# Patient Record
Sex: Male | Born: 1945 | Hispanic: No | Marital: Married | Smoking: Former smoker
Health system: Southern US, Community
[De-identification: ages and names within clinical notes are randomized; demographics above are authoritative.]

## PROBLEM LIST (undated history)

## (undated) DIAGNOSIS — S82201M Unspecified fracture of shaft of right tibia, subsequent encounter for open fracture type I or II with nonunion: Secondary | ICD-10-CM

## (undated) DIAGNOSIS — E785 Hyperlipidemia, unspecified: Secondary | ICD-10-CM

## (undated) DIAGNOSIS — I10 Essential (primary) hypertension: Secondary | ICD-10-CM

## (undated) DIAGNOSIS — E119 Type 2 diabetes mellitus without complications: Secondary | ICD-10-CM

## (undated) DIAGNOSIS — I255 Ischemic cardiomyopathy: Secondary | ICD-10-CM

## (undated) DIAGNOSIS — I509 Heart failure, unspecified: Secondary | ICD-10-CM

## (undated) DIAGNOSIS — Z950 Presence of cardiac pacemaker: Secondary | ICD-10-CM

## (undated) DIAGNOSIS — I495 Sick sinus syndrome: Secondary | ICD-10-CM

## (undated) DIAGNOSIS — I2510 Atherosclerotic heart disease of native coronary artery without angina pectoris: Secondary | ICD-10-CM

## (undated) DIAGNOSIS — J45909 Unspecified asthma, uncomplicated: Secondary | ICD-10-CM

## (undated) DIAGNOSIS — S82401M Unspecified fracture of shaft of right fibula, subsequent encounter for open fracture type I or II with nonunion: Secondary | ICD-10-CM

## (undated) HISTORY — PX: APPENDECTOMY: SHX54

## (undated) HISTORY — DX: Unspecified asthma, uncomplicated: J45.909

## (undated) HISTORY — PX: HERNIA REPAIR: SHX51

## (undated) HISTORY — DX: Ischemic cardiomyopathy: I25.5

## (undated) HISTORY — DX: Essential (primary) hypertension: I10

## (undated) HISTORY — PX: FRACTURE SURGERY: SHX138

## (undated) HISTORY — DX: Heart failure, unspecified: I50.9

## (undated) HISTORY — DX: Atherosclerotic heart disease of native coronary artery without angina pectoris: I25.10

## (undated) HISTORY — DX: Hyperlipidemia, unspecified: E78.5

## (undated) HISTORY — DX: Presence of cardiac pacemaker: Z95.0

## (undated) HISTORY — DX: Sick sinus syndrome: I49.5

## (undated) HISTORY — PX: CATARACT EXTRACTION: SUR2

---

## 2005-12-25 ENCOUNTER — Ambulatory Visit (HOSPITAL_COMMUNITY): Admission: RE | Admit: 2005-12-25 | Discharge: 2005-12-25 | Payer: Self-pay | Admitting: Cardiovascular Disease

## 2005-12-25 ENCOUNTER — Ambulatory Visit: Payer: Self-pay | Admitting: Cardiovascular Disease

## 2005-12-30 ENCOUNTER — Inpatient Hospital Stay (HOSPITAL_BASED_OUTPATIENT_CLINIC_OR_DEPARTMENT_OTHER): Admission: RE | Admit: 2005-12-30 | Discharge: 2005-12-30 | Payer: Self-pay | Admitting: Cardiovascular Disease

## 2005-12-30 ENCOUNTER — Ambulatory Visit: Payer: Self-pay | Admitting: Cardiovascular Disease

## 2006-01-20 ENCOUNTER — Observation Stay (HOSPITAL_COMMUNITY): Admission: RE | Admit: 2006-01-20 | Discharge: 2006-01-21 | Payer: Self-pay | Admitting: Internal Medicine

## 2006-01-20 ENCOUNTER — Ambulatory Visit: Payer: Self-pay | Admitting: Internal Medicine

## 2006-01-20 HISTORY — PX: OTHER SURGICAL HISTORY: SHX169

## 2006-02-04 ENCOUNTER — Ambulatory Visit: Payer: Self-pay

## 2006-03-16 ENCOUNTER — Inpatient Hospital Stay (HOSPITAL_COMMUNITY): Admission: AD | Admit: 2006-03-16 | Discharge: 2006-03-21 | Payer: Self-pay | Admitting: Internal Medicine

## 2006-03-16 ENCOUNTER — Ambulatory Visit: Payer: Self-pay | Admitting: Internal Medicine

## 2006-03-17 ENCOUNTER — Ambulatory Visit: Payer: Self-pay | Admitting: Infectious Diseases

## 2006-03-18 ENCOUNTER — Encounter: Payer: Self-pay | Admitting: Internal Medicine

## 2006-05-12 ENCOUNTER — Ambulatory Visit: Payer: Self-pay | Admitting: Internal Medicine

## 2006-07-31 ENCOUNTER — Ambulatory Visit: Payer: Self-pay | Admitting: Internal Medicine

## 2006-08-11 ENCOUNTER — Ambulatory Visit: Payer: Self-pay | Admitting: Internal Medicine

## 2006-09-28 IMAGING — CR DG CHEST 2V
2 series · 2 of 2 positions shown · non-contrast
Comparison: 01/20/06.

CLINICAL DATA: Left ventricular dysfunction; status-post placement of an implantable defibrillator. 
 CHEST - 2 VIEW:

[w chest pa]
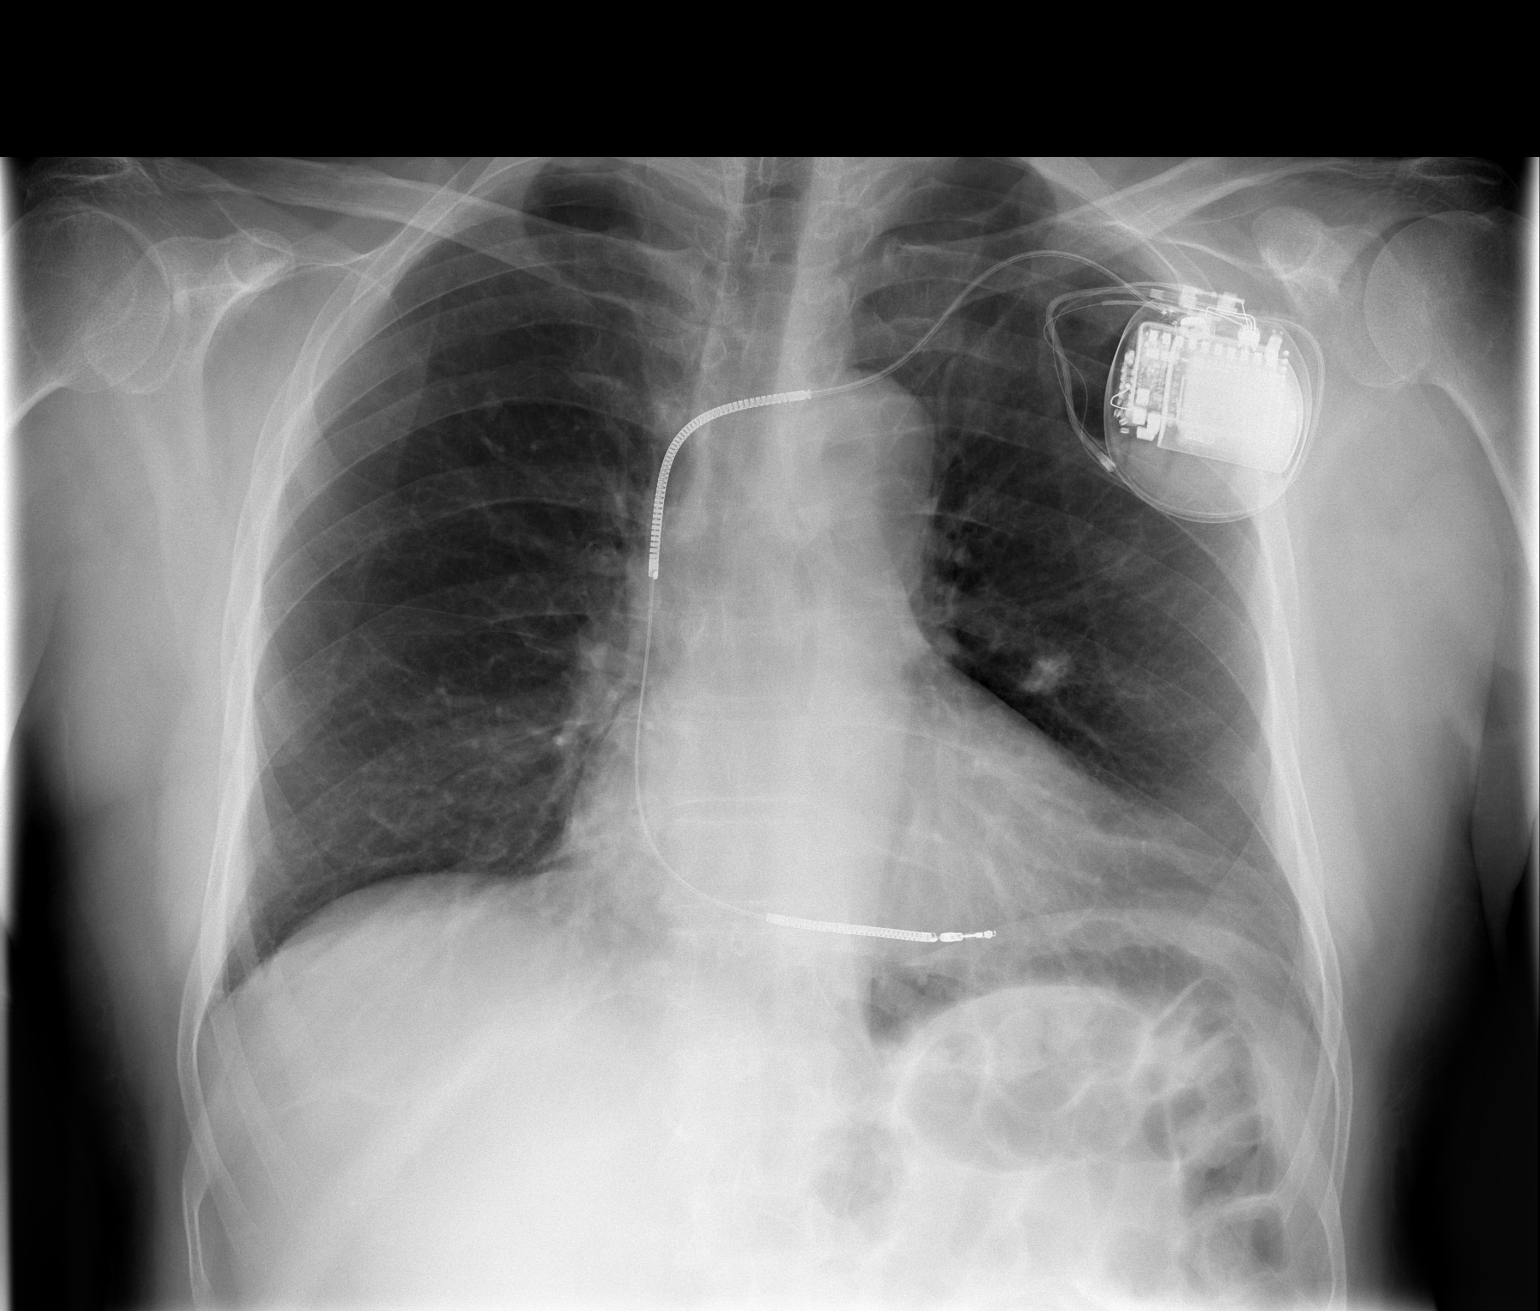

[w chest lat]
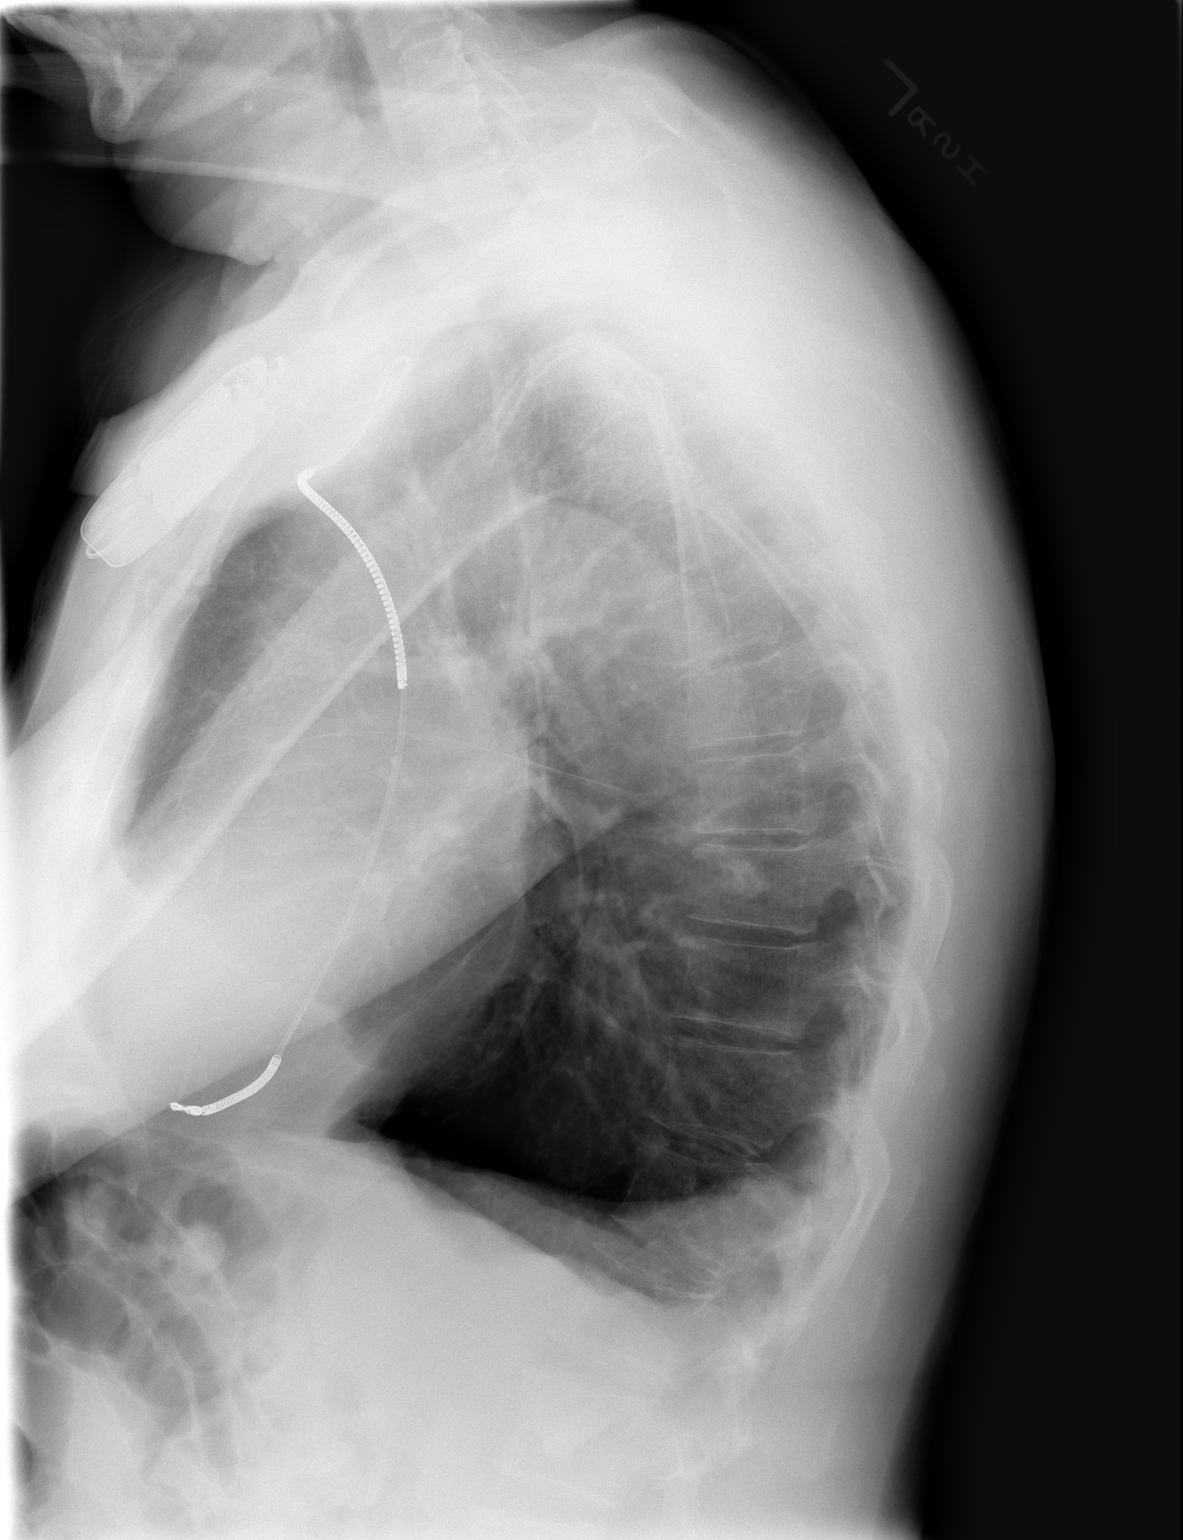

[2 of 2 positions shown; findings below may reference images not displayed]

FINDINGS: A defibrillator has been placed with a right ventricular lead which appears in satisfactory position.  There is no pneumothorax.  Again seen is the stable calcified granuloma within the left midlung zone.  There are no infiltrative or edematous changes.  There are mild changes of COPD.   There is a probable small granuloma within the right upper lobe as well.
IMPRESSION: Placement of implantable defibrillator with lead in satisfactory position radiographically.  No pneumothorax.  Evidence for previous granulomatous disease.

## 2006-10-28 ENCOUNTER — Ambulatory Visit: Payer: Self-pay | Admitting: Internal Medicine

## 2007-01-27 ENCOUNTER — Ambulatory Visit: Payer: Self-pay | Admitting: Internal Medicine

## 2007-02-09 ENCOUNTER — Ambulatory Visit: Payer: Self-pay | Admitting: Internal Medicine

## 2007-05-05 ENCOUNTER — Ambulatory Visit: Payer: Self-pay | Admitting: Internal Medicine

## 2007-05-17 ENCOUNTER — Ambulatory Visit: Payer: Self-pay | Admitting: Internal Medicine

## 2007-08-02 ENCOUNTER — Ambulatory Visit: Payer: Self-pay | Admitting: Internal Medicine

## 2007-10-29 ENCOUNTER — Ambulatory Visit: Payer: Self-pay | Admitting: Internal Medicine

## 2008-02-21 ENCOUNTER — Ambulatory Visit: Payer: Self-pay | Admitting: Internal Medicine

## 2008-11-15 ENCOUNTER — Encounter: Payer: Self-pay | Admitting: Internal Medicine

## 2009-02-03 DIAGNOSIS — I429 Cardiomyopathy, unspecified: Secondary | ICD-10-CM

## 2009-02-03 DIAGNOSIS — E785 Hyperlipidemia, unspecified: Secondary | ICD-10-CM

## 2009-02-03 DIAGNOSIS — I509 Heart failure, unspecified: Secondary | ICD-10-CM

## 2009-02-03 DIAGNOSIS — I2589 Other forms of chronic ischemic heart disease: Secondary | ICD-10-CM

## 2009-02-03 DIAGNOSIS — I495 Sick sinus syndrome: Secondary | ICD-10-CM

## 2009-02-03 DIAGNOSIS — I10 Essential (primary) hypertension: Secondary | ICD-10-CM

## 2009-02-03 DIAGNOSIS — I2510 Atherosclerotic heart disease of native coronary artery without angina pectoris: Secondary | ICD-10-CM

## 2009-02-03 HISTORY — DX: Atherosclerotic heart disease of native coronary artery without angina pectoris: I25.10

## 2009-02-03 HISTORY — DX: Hyperlipidemia, unspecified: E78.5

## 2009-02-03 HISTORY — DX: Sick sinus syndrome: I49.5

## 2009-02-03 HISTORY — DX: Essential (primary) hypertension: I10

## 2009-02-03 HISTORY — DX: Cardiomyopathy, unspecified: I42.9

## 2009-02-28 ENCOUNTER — Encounter (INDEPENDENT_AMBULATORY_CARE_PROVIDER_SITE_OTHER): Payer: Self-pay | Admitting: *Deleted

## 2009-04-13 ENCOUNTER — Encounter (INDEPENDENT_AMBULATORY_CARE_PROVIDER_SITE_OTHER): Payer: Self-pay | Admitting: *Deleted

## 2009-07-10 ENCOUNTER — Ambulatory Visit: Payer: Self-pay | Admitting: Internal Medicine

## 2009-07-10 DIAGNOSIS — Z95810 Presence of automatic (implantable) cardiac defibrillator: Secondary | ICD-10-CM

## 2009-07-10 HISTORY — DX: Presence of automatic (implantable) cardiac defibrillator: Z95.810

## 2009-10-23 ENCOUNTER — Encounter: Payer: Self-pay | Admitting: Internal Medicine

## 2009-12-03 ENCOUNTER — Encounter: Payer: Self-pay | Admitting: Internal Medicine

## 2010-01-03 ENCOUNTER — Telehealth (INDEPENDENT_AMBULATORY_CARE_PROVIDER_SITE_OTHER): Payer: Self-pay | Admitting: *Deleted

## 2010-03-18 ENCOUNTER — Telehealth (INDEPENDENT_AMBULATORY_CARE_PROVIDER_SITE_OTHER): Payer: Self-pay | Admitting: *Deleted

## 2010-04-09 ENCOUNTER — Encounter: Payer: Self-pay | Admitting: Internal Medicine

## 2010-09-10 ENCOUNTER — Encounter (INDEPENDENT_AMBULATORY_CARE_PROVIDER_SITE_OTHER): Payer: Self-pay | Admitting: *Deleted

## 2010-10-02 ENCOUNTER — Encounter: Payer: Self-pay | Admitting: Internal Medicine

## 2010-10-22 NOTE — Letter (Signed)
Summary: Device-Delinquent Phone Journalist, newspaper, Main Office  [address]   [place], [place] [address]   Phone: [phone]  Fax: [phone]     April 09, 2010 MRN: [id number]   [name] [address] [place], [place]  [address]   Dear [name],  According to our records, you were scheduled for a device phone transmission on  10-08-2009.     We did not receive any results from this check.  If you transmitted on your scheduled day, please call us to help troubleshoot your system.  If you forgot to send your transmission, please send one upon receipt of this letter.   Thank you,   Architectural technologist [facility]

## 2010-10-22 NOTE — Progress Notes (Signed)
Summary: CALLING TO SCHEDLUE A PACER PHONE CHECK   Phone Note Call from Patient   Caller: Patient Summary of Call: PT CALLING TO SCHEDULE A PACER PHONE CHECK APPT. Initial call taken by: Judie Grieve,  January 03, 2010 9:46 AM  Follow-up for Phone Call        No appointment needed, they can send transmission at will.  Patient aware. Follow-up by: Altha Harm, LPN,  January 03, 2010 2:35 PM

## 2010-10-22 NOTE — Letter (Signed)
Summary: Device-Delinquent Phone Journalist, newspaper, Main Office  [address]   [place], [place] [address]   Phone: [phone]  Fax: [phone]     December 03, 2009 MRN: [id number]   [name] [address] [place], [place]  [address]   Dear Mr. Deleonardis,  According to our records, you were scheduled for a device phone transmission on October 08, 2009.     We did not receive any results from this check.  If you transmitted on your scheduled day, please call us to help troubleshoot your system.  If you forgot to send your transmission, please send one upon receipt of this letter.  Thank you,   Architectural technologist [facility]

## 2010-10-22 NOTE — Progress Notes (Signed)
Summary: [name]  Records request received from the fax machine. Forwarded to [employer] for processing.

## 2010-10-22 NOTE — Progress Notes (Signed)
Summary: device transmit   Phone Note Call from Patient Call back at Home Phone [phone]   Caller: Patient Reason for Call: Talk to Nurse Summary of Call: having trouble with device transmit Initial call taken by: Migdalia Dk,  January 03, 2010 3:34 PM  Follow-up for Phone Call        Patient unable to do remote follow up. He will call and make an appointment. Follow-up by: Altha Harm, LPN,  January 03, 2010 4:15 PM

## 2010-10-22 NOTE — Letter (Signed)
Summary: Device-Delinquent Phone Journalist, newspaper, Main Office  [address]   [place], [place] [address]   Phone: [phone]  Fax: [phone]     October 23, 2009 MRN: [id number]   [name] [address] [place], [place]  [address]   Dear Mr. Palka,  According to our records, you were scheduled for a device phone transmission on   October 08, 2009.     We did not receive any results from this check.  If you transmitted on your scheduled day, please call us to help troubleshoot your system.  If you forgot to send your transmission, please send one upon receipt of this letter.  Thank you,   Architectural technologist [facility]

## 2010-10-24 NOTE — Letter (Signed)
Summary: Device-Delinquent Check  [facility], Main Office  [address]   [place], [place] [address]   Phone: [phone]  Fax: [phone]     September 10, 2010 MRN: [id number]   METE PURDUM [address] [place], [place]  [address]   Dear Mr. Bellavance,  According to our records, you have not had your implanted device checked in the recommended period of time.  We are unable to determine appropriate device function without checking your device on a regular basis.  Please call our office to schedule an appointment, with Dr Audree Camel soon as possible.  If you are having your device checked by another physician, please call us so that we may update our records.  Thank you,  Letta Moynahan, EMT  September 10, 2010 12:45 PM  [facility] certified

## 2010-10-24 NOTE — Cardiovascular Report (Signed)
Summary: Certified Letter Signed - No Appt Made  Certified Letter Signed - No Appt Made   Imported By: Debby Freiberg 10/10/2010 11:17:26  _____________________________________________________________________  External Attachment:    Type:   Image     Comment:   External Document

## 2011-02-04 NOTE — Assessment & Plan Note (Signed)
[facility]                         ELECTROPHYSIOLOGY OFFICE NOTE   [name], [name]                         MRN:          [id number]  DATE:02/21/2008                            DOB:          April 13, 1946    Ms. Hershberger returns today for follow-up.  He is very pleasant, middle-aged  male with an ischemic cardiomyopathy status post out of hospital MI with  occluded LAD and severe LV dysfunction, EF 25%.  He has had class II  heart failure approaching class III.  He denies chest pain.  He has  dyspnea on exertion.  He denies much in the way of peripheral edema.   His medications include:  1. Toprol XL 50 a day.  2. Simvastatin 10 a day.  3. Coumadin as directed.  4. Altace 5 mg daily.   On physical examination, he is a pleasant, middle-aged man in no acute  distress.  Blood pressure was 158/97, the pulse 73 and regular,  respirations 18.  NECK:  Revealed no jugular venous distention.  LUNGS:  Clear bilaterally to auscultation.  No wheezes, rales or  rhonchi.  CARDIOVASCULAR:  Regular rate rhythm.  Normal S1-S2.  EXTREMITIES:  Demonstrated no edema.   Interrogation of his defibrillator demonstrates a Social research officer, government V-168.  The R-waves were 9.  The impedance 350 with the threshold a volt at 0.5  milliseconds.  Battery voltage was 3.1 volts.  He was less than 1% V  paced.   IMPRESSION:  1. Ischemic cardiopathy.  2. Congestive heart failure.  3. Status post ICD insertion.   DISCUSSION:  Overall, Mr. Trouten is stable.  His heart failure is class  II to III.  I have asked that he go back to walk on a regular basis.  He  may ultimately require some Lasix.     Doylene Canning. Ladona Ridgel, MD  Electronically Signed    GWT/MedQ  DD: 02/21/2008  DT: 02/21/2008  Job #: [id number]

## 2011-02-04 NOTE — Assessment & Plan Note (Signed)
[facility]                         ELECTROPHYSIOLOGY OFFICE NOTE   [name]                         MRN:          [id number]  DATE:02/09/2007                            DOB:          [birth year]    [name] comes here in follow-up.  He is a very pleasant middle-aged  male with a history of a silent out-of-hospital myocardial infarction,  with a severe LV dysfunction and an EF of approximately 30%.  He is  status post prophylactic ICD insertion.  He has congestive heart failure  which is nicely controlled.   MEDICATIONS:  1. Toprol XL 50 mg a day.  2. Aspirin.  3. Simvastatin.  4. Coumadin.  5. Altace.   The patient has no specific complaints today.   PHYSICAL EXAMINATION:  GENERAL:  He is a pleasant middle-aged man in no  acute distress.  VITAL SIGNS:  The blood pressure today was 142/93, the pulse 75 and  regular, the respirations were 18.  Weight was 178 pounds.  NECK:  No jugular venous distention.  LUNGS:  Clear bilaterally to auscultation.  There were no wheezes, rales  or rhonchi.  CARDIOVASCULAR:  A regular rate and rhythm with a normal S1 and S2.  EXTREMITIES:  No cyanosis, clubbing or edema.   Interrogation of his defibrillator demonstrates a St. Jude Atlas V-168  single-chamber defibrillator with R waves of 9, the impedance 355 Ohms,  threshold of 0.5 at 0.5 V.  The battery voltage is 3.15 V.  There were  no intercurrent IC therapies.   IMPRESSION:  1. Ischemic cardiomyopathy.  2. Congestive heart failure.  3. Status post implantable cardioverter-defibrillator insertion.   DISCUSSION:  Overall, [name] is stable.  He is tolerating his beta  blocker and ACE inhibitor very nicely.  He will continue on his present  medical regimen and I have encouraged him to be as active as possible.  We will plan to see him back in the office in several months.     Doylene Canning. Ladona Ridgel, MD  Electronically Signed    GWT/MedQ  DD:  02/09/2007  DT: 02/09/2007  Job #: [id number]   cc:   Aundra Dubin. Revankar, M.D.

## 2011-02-07 NOTE — Assessment & Plan Note (Signed)
[name]                           ELECTROPHYSIOLOGY OFFICE NOTE   [name], [name]                         [name]:          [id number]  DATE:08/11/2006                            DOB:          1946-06-16    [name] returns today for followup.  [name] is a very pleasant middle-aged  male with a history of an ischemic cardiomyopathy and out-of-hospital  myocardial infarction.  [name] has a history of dyslipidemia, history of  hypertension, history of chronic Coumadin therapy, who returns today for  followup.  [name] denies chest pain but notes a recent ICD shock which occurred  while [name] was singing in [name] choir early in the morning.  The [name] denies  chest pain or shortness of breath and had no symptoms prior to the event.   EXAMINATION:  GENERAL:  [name] is a pleasant well-appearing man in no distress.  VITAL SIGNS:  Blood pressure today was 170/107, pulse was 76 and regular,  respirations were 18, the weight was 180 pounds.  NECK:  Revealed no jugular venous distention.  LUNGS:  Clear bilaterally to auscultation.  CARDIOVASCULAR:  Revealed a regular rate and rhythm with an S4 gallop.  EXTREMITIES:  Demonstrated no edema.   Interrogation of [name] defibrillator demonstrates a Social research officer, government V-168.  [name]  R waves are greater than 12.  [name] impedance was 395 ohms and the pacing  threshold 0.75 at 0.5.  The battery voltage is greater than 3.2 volts.  Interrogation of [name] electrograms demonstrated what appears to be an episode  of SVT, probably atrial tachycardia, as it was not terminated with  ventricular pacing.  It did terminate with shock therapy.  Of note, the  [name] had not taken [name] medicine as [name] episode occurred in the morning  while [name] was singing in [name] choir.  Of note, the [name]'s discriminators  did demonstrate that the episode was SVT, though the time out and the  initial stability called it VT; hence, the [name] received a shock.    IMPRESSION:  1. Ischemic cardiomyopathy.  2. Nonsustained ventricular tachycardia.  3. Atrial tachycardia.  4. Status post implantable cardioverter defibrillator implantation with      implantable cardioverter defibrillator shock secondary to likely atrial      tachycardia.   DISCUSSION:  I have today increased [name]'s Toprol-XL from 67 a  day to 50 twice a day.  With [name] adjustments in [name] defibrillator, hopefully  [name] will have no inappropriate ICD shocks.    Doylene Canning. Ladona Ridgel, MD  Electronically Signed   GWT/MedQ  DD: 08/11/2006  DT: 08/11/2006  Job #: [id number]   cc:   Aundra Dubin. Revankar, M.D.

## 2011-02-07 NOTE — Op Note (Signed)
[name], [name] NO.:  [id number]   MEDICAL RECORD NO.:  [id number]          PATIENT TYPE:  INP   LOCATION:  3729                         FACILITY:  [facility]   PHYSICIAN:  Doylene Canning. Ladona Ridgel, M.D.  DATE OF BIRTH:  12/29/1945   DATE OF PROCEDURE:  01/20/2006  DATE OF DISCHARGE:                                 OPERATIVE REPORT   PROCEDURE PERFORMED:  Implantation of a single chamber defibrillator.   INDICATION:  Ischemic cardiomyopathy status post anterior MI with an EF of  30% and class II congestive heart failure.   INTRODUCTION:  The patient is a 65 year old man who is status post anterior  MI over a year ago.  He did not seek medical attention and was subsequently  found, because of increasing dyspnea and shortness of breath, to have an EF  of 30%. and a large anterior scar by stress testing.  The patient did have  catheterization done which demonstrated confirmation of the LV dysfunction  with EF of 30% and an isolated occluded LAD with very minimal if any  collateralization.  The patient had no evidence of viability of the anterior  wall and he has been referred for medical therapy of his ischemic  cardiomyopathy.  He has no frank syncope.  He does have occasional  palpitations.  He has class II heart failure symptoms despite maximal  medical therapy with beta blockers and ACE inhibitors.  He is now referred  for ICD implantation (Mobitz II).   PROCEDURE:  After informed consent was obtained, the patient is taken  diagnostic EP lab in the fasting state.  After the usual preparation and  draping, intravenous fentanyl and midazolam was given for sedation.  30 mL  of lidocaine was infiltrated into the left infraclavicular region.  A 7 cm  incision was carried out over this region and electrocautery utilized to  dissect down to the fascial plane.  10 mL of contrast injected into the left  upper extremity venous system demonstrated a patent left subclavian  vein.  This was subsequently punctured x1 and the St. Jude model 7000, 60-cm active  fixation defibrillation lead serial number, [id number], was advanced into the  right ventricle.  Mapping was carried out at the final site. The R-waves  measured 15 mV and the pacing threshold 0.9 volts at 0.5 milliseconds with a  pacing impedance of 650 ohms.  10 volt pacing in this location did not  stimulate the diaphragm once the lead was actively fixed.  The lead was then  secured to the subpectoralis fascia with a figure-of-eight silk suture.  The  sewing sleeve was also secured with a silk suture.  Electrocautery was  utilized to make a subcutaneous pocket.  Kanamycin irrigation was utilized  to irrigate the pocket and electrocautery utilized to assure hemostasis.  The St. Jude Atlas II VR, model V-168 single chamber defibrillator, serial  number [id number], was connected to the defibrillation lead and placed back in  the subcutaneous pocket.  A silk suture was utilized to secure the generator  to the fascia.  Additional kanamycin was utilized  to irrigate the pocket and  defibrillation threshold testing carried out.   After the patient was more deeply sedated with fentanyl and Versed, VF was  induced with a T-wave shock and a 15 joules shock was delivered terminating  ventricular fibrillation and restoring sinus rhythm.  Five minutes was  allowed to elapse and a second VFT test carried out.  Again VF was induced  with a T-wave shock and, again, a 15 joules shock was delivered which  terminated VF and restored sinus rhythm.  At this point, no additional  defibrillation threshold testing was carried out and the incision closed  with layer of 2-0 Vicryl followed by a layer of 3-0 Vicryl followed by a  layer of 4-0 Vicryl.  Benzoin was painted on the skin, Steri-Strips were  applied, and a pressure dressing was placed, and the patient was returned to  his room in satisfactory condition.   COMPLICATIONS:   There were no immediate procedure complications.   RESULTS:  This demonstrate successful implantation of a St. Jude single  chamber defibrillator in a patient with an ischemic cardiomyopathy and class  II heart failure, EF of 30%.           ______________________________  Doylene Canning. Ladona Ridgel, M.D.     GWT/MEDQ  D:  01/20/2006  T:  01/20/2006  Job:  [id number]   cc:   Aundra Dubin. Revankar, M.D.  Fax: [phone]

## 2011-02-07 NOTE — Cardiovascular Report (Signed)
NAMELARANCE, RATLEDGE NO.:  [id number]   MEDICAL RECORD NO.:  [id number]          PATIENT TYPE:  OIB   LOCATION:  1967                         FACILITY:  [facility]   PHYSICIAN:  Charlton Haws, M.D.     DATE OF BIRTH:  1946/07/31   DATE OF PROCEDURE:  12/30/2005  DATE OF DISCHARGE:  12/30/2005                              CARDIAC CATHETERIZATION   [name] is a 65 year old patient who has had a silent possible anterior  wall MI sometime in the last two years. Catheterization was done to assess  for residual coronary disease prior to AICD implantation   Cine catheterization done 4 French catheters from the right femoral artery.   Left main coronary artery had 20% distal stenosis.   The proximal LAD was somewhat aneurysmal with 40-50% tubular disease.  The  mid LAD was 100% occluded after the takeoff of a large first diagonal  branch.  There were some left-to-left and right-to-left collaterals but this  was a chronic occlusion. The first diagonal branch was large and had 40%  tubular disease.   The circumflex coronary artery was large and left dominant.  There was some  aneurysmal disease proximally with 30% lesions.  The first obtuse marginal  branch was large and normal.  The second obtuse marginal branch was somewhat  smaller and normal.  The PDA was normal.   The right coronary artery was small and nondominant.  There was a 70% lesion  in the proximal RV branch.  There were some right-to-left collaterals to the  LAD, as well.   The patient had a cardiac MRI last Thursday that showed an EF of 31% with a  dense anteroapical and septal wall scar with no viability anterior wall and  a small mural apical thrombus.  We, therefore, did not cross aortic valve to  decrease the risk of CVA.  The  aortic pressure was 145/85 after 1.25 with  Enalopred as the patient was initially somewhat hypertensive.   From a coronary standpoint, the patient can be treated medically.   He does  not know exactly with his medications are. He needs to be on Coreg and an  ACE inhibitor.  These can be titrated by Dr. Tomie China.   Dr. Ladona Ridgel has reviewed his MRI and cath with me.  He is going to talk to  the patient now about coming back in for an AICD.  He meets class II  recommendations with a dense anterior wall scar, EF of 31%, and coronary  disease.   I will leave it up to Dr. Tomie China after the defibrillator is placed if he  wants to put the patient on a course of six months of Coumadin for his  apical clot.  However, I suspect that he has had this for quite some time  since his dyspnea is 60-17 years old.  Further recommendations will be given  by EPS and the patient will follow-up medically with Dr. Tomie China.          ______________________________  Charlton Haws, M.D.    PN/MEDQ  D:  12/30/2005  T:  12/30/2005  Job:  [id number]

## 2011-02-07 NOTE — Consult Note (Signed)
[name], CHAPPLE NO.:  [id number]   MEDICAL RECORD NO.:  [id number]           PATIENT TYPE:   LOCATION:                               FACILITY:  [facility]   PHYSICIAN:  Doylene Canning. Ladona Ridgel, M.D.       DATE OF BIRTH:   DATE OF CONSULTATION:  12/30/2005  DATE OF DISCHARGE:  12/30/2005                                   CONSULTATION   REASON FOR CONSULTATION:  Evaluation in a patient with ischemic  cardiomyopathy for possible ICD implantation.   HISTORY OF PRESENT ILLNESS:  The patient is a very pleasant 65 year old male  with a history of hypertension.  He sustained an out of hospital myocardial  infarction for which he did not seek medical care in the past, the distance  back in time of which we do not know.  The patient also developed congestive  heart failure symptoms associated with dyspnea on exertion and presented for  evaluation to Dr. Tomie China down in [place] where he was found on Cardiolite  stress testing to have LV dysfunction and a large anterior apical scar.  Subsequent MRI demonstrated transmural infarction with large scar and EF of  31%.  He underwent catheterization which demonstrated an occluded LAD just  after a large diagonal branch with very minimal, if any, collateralization  of the inferior apex.  It should be noted that the LAD did traverse around  the apex, supplying the distal portion of the inferior wall.  The patient  had no additional obstructive coronary disease.  He is now referred for  evaluation.  He denies syncope.  He has class 2 congestive heart failure  symptoms.  He denies angina.   PAST MEDICAL HISTORY:  Notable for hypertension.  He has otherwise been  healthy.   FAMILY HISTORY:  Notable for a sister who died suddenly in her 64s.  Both of  his parents lived to be elderly, dying of old age.   SOCIAL HISTORY:  The patient is married.  He has eight children and denies  tobacco or ethanol use presently.  He works still in  Set designer.   REVIEW OF SYSTEMS:  Notable for the patient wearing glasses for visual  acuity.  He denies hearing or vision problems.  He denies nausea, vomiting  diarrhea, or constipation, polyuria, polydipsia, or cold intolerance, or  recent weight changes.  He denies skin problems.  He denies arthritic  complaints.  He denies easy bruisability or other hematologic problems.  He  denies any neurologic problems, except for he does have generalized  weakness.  He denies claudication, cough, or hemoptysis.  The rest of his  review of systems was reviewed and found to negative.   PHYSICAL EXAMINATION:  GENERAL:  He is a pleasant middle-aged man in no  acute distress.  VITAL SIGNS:  Blood pressure is 115/70, the pulse was 72 and regular,  respirations are 18, temperature is 98.  HEENT:  Normocephalic, atraumatic.  Pupils equal and round.  Oropharynx is  moist.  Sclerae anicteric.  NECK:  The carotids are 2+ and symmetric.  There  is no thyromegaly.  Trachea  was midline.  There were 6 cm jugular venous distention.  LUNGS:  Clear bilaterally to auscultation.  There were no wheezes, rales,  rhonchi, and no increased work of breathing.  CARDIOVASCULAR:  Regular rate and rhythm with normal S1 and S2.  There was a  soft systolic murmur at the left lower sternal border and there was left  apical heave.  His PMI was laterally displaced and enlarged.  ABDOMEN:  Soft, nontender, nondistended.  There was no organomegaly.  Bowel  sounds were present.  There was no rebound or guarding.  EXTREMITIES:  Demonstrated no cyanosis, clubbing, or edema.  There was a dry  bandage over his right groin region from his catheterization done today.  Pulses were 2+ and symmetric.  The joint exam was normal.  SKIN:  Exam was normal.  NEUROLOGIC:  Alert and oriented x3 with cranial nerves intact.  Strength was  5/5 and symmetric.   LABORATORY DATA:  EKG demonstrates sinus rhythm.   IMPRESSION:  1.  Ischemic  cardiomyopathy, status post myocardial infarction with large      anterior apical  aneurysm.  2.  Congestive heart failure, presently class 2, on medical therapy.  3.  Hypertension.  4.  Dyslipidemia.   DISCUSSION:  I have discussed and recommended prophylactic ICD implantation  to the patient.  He states that he would like to talk with Dr. Tomie China  regarding these issues and will call us to schedule ICD implant.           ______________________________  Doylene Canning. Ladona Ridgel, M.D.     GWT/MEDQ  D:  12/30/2005  T:  12/30/2005  Job:  [id number]   cc:   Aundra Dubin. Revankar, M.D.  Fax: [phone]

## 2011-02-07 NOTE — Discharge Summary (Signed)
NAMELELON, Christopher NO.:  [id number]   MEDICAL RECORD NO.:  [id number]          PATIENT TYPE:  INP   LOCATION:  3729                         FACILITY:  [facility]   PHYSICIAN:  Doylene Canning. Ladona Ridgel, M.D.  DATE OF BIRTH:  [birth year]   DATE OF ADMISSION:  01/20/2006  DATE OF DISCHARGE:  01/21/2006                                 DISCHARGE SUMMARY   This patient has no known drug allergies.   PRINCIPAL DIAGNOSES:  1.  Discharging day one, status post implantation St. Jude ATLAS II VR      cardioverter defibrillator.  2.  Granulomatous lung disease (calcified granulomata), on CT scan on Jan 20, 2006, with finding of a 5-mm noncalcified nodule at the right apex.      1.  RECOMMEND FOLLOWUP CT SCAN IN SIX MONTHS.   SECONDARY DIAGNOSES:  1.  History of myocardial infarction with no medical intervention at the      time of infarct (about two years ago).  2.  Class II congestive heart failure.  3.  Ischemic cardiomyopathy with an ejection fraction of 31% and      catheterization in December 27, 2005.  4.  Coronary artery disease.      1.  Catheterization with 100% of left anterior descending; left          circumflex, right coronary artery nonobstructive coronary artery          disease.  5.  Patient presented with dyspnea in 2007; had abnormal Cardiolite studies      (left ventricular dysfunction, anterior apical scar).  6.  Hypertension.  7.  Dyslipidemia.  8.  History of tobacco habituation, having quit three years ago.  9.  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 Possible      anticoagulation to be pursued by Dr. Tomie China.   PROCEDURE:  On Jan 20, 2006, implantation of St. Jude ATLAS II VR  cardioverter defibrillator, Dr. Lewayne Bunting. Defibrillator threshold study  was less than or equal to 15 joules. The patient has had no postprocedural  complications.   BRIEF HISTORY:  [name] is a 65 year old male who first presented to Dr.  Tomie China in  referral with dyspnea determined to be class II congestive heart  failure. He had an outpatient Myoview which showed anterior wall infarct and  left ventricular dysfunction, who was referred to Dr. Charlton Haws who  scheduled a cardiac MRI. The study showed that there was a nonviable  anterior wall and apex with an ejection fraction of 32%. Heart  catheterization was  recommended in this patient who had a silent anterior  wall myocardial infarction and evidence of ischemic cardiomyopathy.   The patient underwent catheterization on December 30, 2005. Study that the LAD  was 100% occluded. This was a chronic occlusion. The left circumflex and  right coronary artery had nonobstructive coronary artery disease, and  ejection fraction at that time was 31%. Medical therapy recommended to be  pursued and also a recommendation that the patient see Dr. Ladona Ridgel for  electrophysiology consult.  The patient also has been found to have apical  clot and there is recommendation by Dr. Eden Emms that the patient could  tolerate a course of six months of Coumadin for this, however this is  probably and since the patient has had congestive failure symptoms for these  two years. The decision to pursue anticoagulation will be left to Dr.  Tomie China.   The patient was seen by Dr. Ladona Ridgel as well on April 10th in consultation.  The patient is a candidate for prophylactic ICD implantation and Dr. Ladona Ridgel  recommended implantation of ICD on an elective basis. The patient stated  that he would talk with Dr. Tomie China about this.   HOSPITAL COURSE:  The patient presented electively on May 1st for  implantation of cardioverter defibrillator. Chest x-ray showed a 1.1 x 0.8-  cm superior segment left lower lobe lung nodule which is likely classified.  A follow-up CT of the chest was recommended. This was pursued and the study  showed evidence of calcified granulomata which was chronic from previous  disease, however there was a  5-mm noncalcified nodule in the right apex. The  patient is a prior smoker and recommendation is a follow up CT scan in six  months. The patient underwent successful implantation of a cardioverter  defibrillator and is ready for discharge postprocedure day #1. There is no  hematoma a the incision and the patient is not having any undue pain. Dr.  Ladona Ridgel has outlined when the patient can go back to work, it will be on Feb 04, 2006. The patient is to follow a low sodium, low cholesterol diet. He is  asked not to drive for the next week. He is asked not to lift anything  heavier than 10 pounds for the next four weeks. He is to keep his incision  dry for the next seven days, is to sponge bathe until Tuesday, May 8th.   DISCHARGE MEDICATIONS:  1.  Toprol-XL 50 mg daily.  2.  Enteric-coated aspirin 81 mg daily.  3.  Simvastatin 10 mg daily at bedtime.  4.  Altace 5 mg daily.   In regards to medications Dr. Eden Emms has recommended that the patient be on  both an ACE inhibitor which he is currently taking and also to be on Coreg,  to be uptitrated by Dr. Tomie China.   PLAN:  1.  Follow up with Dr. Tomie China on Friday, May 4th at 2:15.  2.  Arrange CT scan for six months in the future to re-assess nodule in the      right lung apex.   FOLLOWUP:  1.  [facility], [address], ICD Clinic on Wednesday,      May 16th at 9:00.  2.  To see Dr. Ladona Ridgel on August 21st at noon.   LABORATORY STUDIES THIS ADMISSION:  CBC reveals hemoglobin 15.5, hematocrit  45.6, white count 8.2, platelet count 210,000. Serum electrolytes reveal  sodium of 140, potassium 4.5, chloride 104, bicarbonate 29, BUN 6,  creatinine 0.9, glucose 111.  PT is 12.7, INR 0.9.      Maple Mirza, P.A.    ______________________________  Doylene Canning. Ladona Ridgel, M.D.    GM/MEDQ  D:  01/21/2006  T:  01/21/2006  Job:  [id number]  cc:   Aundra Dubin. Revankar, M.D.  Fax: [phone]   Doylene Canning. Ladona Ridgel, M.D.  [address]  [address]  [place]  [place] [address]

## 2011-02-07 NOTE — Assessment & Plan Note (Signed)
[facility]                           ELECTROPHYSIOLOGY OFFICE NOTE   ANTON, CHERAMIE                         MRN:          [id number]  DATE:05/12/2006                            DOB:          [birth year]    Mr. Giovanetti returns today for followup.  He is a very pleasant middle-aged  male with a history of ischemic cardiomyopathy (silent MI) with severe LV  dysfunction, who is status post ICD insertion back in May.  The patient was  hospitalized back in June with a left lower lobe pneumonia.  He has been  treated with antibiotics and has improved.  He has had no recurrent fevers  or chills and overall feels well.   PHYSICAL EXAMINATION:  GENERAL:  On exam, he is a pleasant middle-aged man  in no distress.  VITAL SIGNS:  The blood pressure today was 170/100, the pulse was 100 and  regular, respirations were 18 and the weight was 174 pounds.  NECK:  No jugular venous distention.  LUNGS:  Clear bilaterally to auscultation.  CARDIOVASCULAR:  Regular rate and rhythm with normal S1 and S2.  The  pacemaker/ICD pocket demonstrates no effusions or erythema.  It is  nontender.  EXTREMITIES:  No cyanosis, clubbing or edema.   DEVICE INTERROGATION:  Interrogation of his device demonstrates a St. Jude  model V-168 single-chamber defibrillator with R waves greater than 12, a  pacing impedance of 395 ohms and a pacing threshold of 0.75 at 0.5.  The  battery voltage was 3.15 volts.   IMPRESSION:  1. Ischemic cardiomyopathy.  2. Congestive heart failure.  3. Status post implantable cardioverter-defibrillator insertion.   DISCUSSION:  Overall, Mr. Schnitker is stable.  His defibrillator is working  normally.  There is no evidence of any residual infection.                                   Doylene Canning. Ladona Ridgel, MD   GWT/MedQ  DD:  05/12/2006  DT:  05/13/2006  Job #:  [id number]   cc:   Aundra Dubin. Revankar, MD

## 2011-02-07 NOTE — Discharge Summary (Signed)
NAMEJEWELZ, KOBUS NO.:  [id number]   MEDICAL RECORD NO.:  [id number]          PATIENT TYPE:  INP   LOCATION:  6702                         FACILITY:  [facility]   PHYSICIAN:  C. Ulyess Mort, M.D.DATE OF BIRTH:  Nov 27, 1945   DATE OF ADMISSION:  03/16/2006  DATE OF DISCHARGE:  03/21/2006                                 DISCHARGE SUMMARY   ATTENDING PHYSICIAN:  Dr. Wyonia Hough   DISCHARGE DIAGNOSES:  1.  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  2.  Ischemic cardiomyopathy; status post automatic implantable cardioverter-      defibrillator placement, May 2007, by [facility].  3.  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  4.  Coronary artery  disease; status post catheterization, December 27, 2005,      with 100% occlusion of the left anterior descending with nonobstructive      disease to the left circumflex and right coronary.  5.  A 5 mm non calcified nodule to the right apex by computerized tomography      initially May of 2007 and June 2007.  6.  Hypertension.  7.  Hyperlipidemia.  8.  History of sick sinus syndrome; status post pacemaker placement.   MEDICATIONS ON DISCHARGE:  1.  Doxycycline 100 mg one tablet p.o. twice per day x seven days.  2.  Ramipril 5 mg one tablet daily.  3.  Simvastatin 10 mg one tablet daily.  4.  Metoprolol XL 50 mg one tablet daily.  5.  Aspirin 81 mg one tablet daily.  6.  Coumadin 6 mg daily.   HOSPITAL FOLLOW-UP APPOINTMENTS:  1.  Doylene Canning. Ladona Ridgel, M.D. of [facility] on August 21st at [place].  2.  Aundra Dubin. Revankar, M.D. in [place], [place]; office telephone      number [phone];  [address]:  Primary care physician to      manage Coumadin therapy as well as follow up related to hospitalization.   CONSULTATIONS:  1.  Infectious disease.  2.  Cardiology.  3.  Pulmonology.   PROCEDURES PERFORMED:  1.  CT scan of the chest with contrast, March 17, 2006:  Demonstrating 8 x 14      mm calcification to the region of the left lower lobe with new      subcentimeter nodules associated with both lungs bilaterally; a 5 mm      granuloma to the right apex; and significant atelectasis to the left      lower lobe as compared to CT scan of Jan 20, 2006.  2.  Transesophageal echocardiogram, March 19, 2006:  Demonstrating ejection      fraction 45%, with apical akinesis and distal anterior wall hypokinesis.      A small Chiari network noted to the right atrium adjacent to the      pacemaker wire, but no obvious vegetation,  with trivial tricuspid      regurgitation, but no vegetation, no patent foramen ovale.   CONDITION ON DISCHARGE:  Stable.   HISTORY OF PRESENT ILLNESS:  Mr. Labelle is a 65 year old Pakistani born  gentleman who presented via transfer from [facility] after being  admitted on March 09, 2006 with a 4-day history of fever, rigors and  nonproductive cough and shortness of breath.  His work-up at that time  included a chest x-ray demonstrating left lower lobe pneumonia, as well as a  1.5 cm granuloma to the left lower lobe.  CT scan of the chest at that time  did not reveal any evidence for adenopathy or pulmonary embolism; however,  arterial blood gas demonstrated hypoxia.  The patient was initially admitted  with two days of IV levofloxacin and ceftriaxone; and was eventually changed  to Primaxin plus gentamicin for the next few days; with the eventual  addition of vancomycin and azithromycin by March 14, 2006.  During that time  the patient had blood cultures which were unremarkable; sputum culture which  was unremarkable; urine culture which was also unremarkable.   Because of the patient's persisting leukocytosis and fevers, he was  transferred on March 16, 2006.  Of note,  the patient had a PPD test performed  there which was strongly positive; suspicion for atypical tuberculosis was  made.  However, the patient did not have AFB smears performed during that  admission.  The patient did have a 2-D echocardiogram demonstrating 50-55%  with no comment by vegetation.   MEDICATIONS:  Home medications prior to admission included Toprol XL 50 mg  daily; and Coumadin 6 mg daily.   SOCIAL HISTORY:  The patient was a former smoker, one pack per day for  approximately 20+ years.  Quit alcohol two years ago.  The patient currently  was working as a Theme park manager in [place], was married, had eight  children.   PHYSICAL EXAMINATION:  VITAL SIGNS:  Temperature 101.5, blood pressure  137/95, pulse 109, respirations 20, O2 saturation 98% on room air.  GENERAL APPEARANCE:  In general, the patient was in significant distress,  advocating severe rigors and fever.  HEENT:  HEENT revealed no specific adenopathy nor oral ulcers or lesions.  RESPIRATORY:  Respiratory exam showed decreased breath sounds throughout,  with faint coarse bibasilar crackles, in the left greater than the right,  without appreciable wheezes or rhonchi.  CARDIOVASCULAR:  Tachycardic with no appreciable murmurs, rubs or gallops,  but was regular.  ABDOMEN:  The abdomen was soft, nontender, nondistended, with no  hepatosplenomegaly.  SKIN:  Skin exam showed no rashes or petechiae.  NEUROLOGIC:  The patient was grossly intact.   ADMISSION LABORATORY STUDIES:  Labs from [facility] the day prior to  admission included sodium 132, potassium 3.9, chloride 98, bicarbonate 23,  BUN 8, creatinine 0.9, glucose 158.  White blood cell count 17.3, with  absolute neutrophil count of 13.8, hemoglobin 12.0, hematocrit 35.1,  platelets 350,000, MCV was 82.  Smear demonstrated Dohle bodies.  A malaria  smear also negative.   CT scan of the chest on June 20th at [facility] demonstrated left lower  lobe consolidation with air bronchograms and atelectasis versus  infiltrates in the right lower lobe, with a 1.5 cm left lower lobe  granuloma.  Arterial blood gas on June 21st at [facility]  demonstrated pH 7.44, pCO2 38, pO2 of 70, bicarbonate 26, with an FiO2 of  room air.   HOSPITAL  COURSE:  PROBLEM #1:  Fever, leukocytosis, with left lower lobe  infiltrate.  Mr. Goldwater was admitted and chosen to be continued on vancomycin and  Primaxin; and placed on respiratory isolation with AFB smears collected.  Over the course of the next few days with albuterol and Atrovent nebulized  therapy, the patient responded remarkably with a decrease to his  leukocytosis and fever.  After the second peripheral smear was negative for  AFB, the likelihood for tuberculosis was minimal.  He was therefore  discontinued off respiratory isolation.  Infectious disease consultation was obtained initially during admission; and  recommendations were for a transesophageal echocardiogram to rule out  pulmonary embolism.  In light of the patient's recent AICD placement in May  of 200I, as suspicion for septic emboli was concerning, the patient  underwent this procedure and did not demonstrate any sign of vegetation.  In addition, pulmonology was consulted for assistance for possible  bronchoscopy related to this consolidated lesion in the left lower lobe.  However, it appears that the patient was very angry and refused the services  of bronchoscopy.  As this during the time of his isolation, pulmonology did  not feel any benefit would be obtained via bronchoscopy; and only  recommended continuing antibiotic therapy.  This was continued.  As the patient's fever curve improved, it was decided that the patient would  be appropriate for doxycycline therapy.  Of note, at no time did the patient  have any positive blood cultures, sputum cultures or urine cultures during  this admission.  Legionella urine antigen was  also negative; and  Pneumocystis smear was also negative.  Blood cultures for fungus were  negative as well.   The patient was discharged with a prescription for doxycycline to be  completed for approximately seven more days upon discharge.   PROBLEM #2:  Status post automatic implantable cardioverter-defibrillator  placement in May of 2007 for ischemic cardiomyopathy as well as sick sinus  syndrome.  The patient at no time displayed any evidence of chest pain or  congestive heart failure.  The patient was aware of his follow-up  appointment with [facility] in August of 2007.   PROBLEM #3:  Hyperlipidemia.  The patient had indicated he had been on Zocor therapy.  Review of his last  discharge summary indicated that this dosing was approximately 10 mg.  He  was continued on 20 mg during this admission; however, no lipid panel was  obtained during this admission.  He will continue to follow up with his  primary care physician for further titration.  PROBLEM #4:  Hypertension.  The patient was continued on metoprolol 50 mg twice per day; and ramipril 5  mg daily, with excellent control of his blood pressure.  He was discharged  with instructions to continue his Toprol XL 50 mg daily, and was given a  prescription for ramipril 5 mg daily; with instructions to follow up with  his primary care physician.   PROBLEM #5:  A 5 mm noncalcified nodule to the right apex of the lung.  Per  the patient's last discharge summary, recommendation for followup CT scan in  six months is warranted, given his history of smoking use in the past.  This  is recommended as an outpatient.   PROBLEM #6:  History of small mural apical thrombus to the cardiac MRI scan  in April 2007.  The patient is currently on chronic Coumadin therapy; and this was continued  during this admission.  He was advised to follow up with Dr. Tomie China, his  primary care physician, related to further management of the  patient.   PERTINENT LABORATORY STUDIES:  Discharge BUN 7, creatinine 1.1.  Hemoglobin  12.0, hematocrit 35.4, platelets 452,000, white blood cell count 14.3.  AFB  induced sputum x two negative.  Legionella antigen negative.  Pneumocystis smear negative.  PT 27.7, INR 2.6, AST 68, ALT 69.  On March 16, 2006 on albumin 2.5 (the patient is encouraged to follow up with  his primary care physician related to this slight elevation to the LFTs for  further management ).      Coralie Carpen, M.D.  Electronically Signed      C. Ulyess Mort, M.D.  Electronically Signed    FR/MEDQ  D:  03/21/2006  T:  03/21/2006  Job:  [id number]   cc:   C. Ulyess Mort, M.D.  Fax: [phone]   Doylene Canning. Ladona Ridgel, M.D.  [address]  [address]  [place]  [place] [address]   Aundra Dubin. Revankar, M.D.  Fax: [phone]

## 2011-02-07 NOTE — H&P (Signed)
[name], Christopher NO.:  [id number]   MEDICAL RECORD NO.:  [id number]          PATIENT TYPE:  OIB   LOCATION:  1967                         FACILITY:  [facility]   PHYSICIAN:  Charlton Haws, M.D.     DATE OF BIRTH:  [birth year]   DATE OF ADMISSION:  12/30/2005  DATE OF DISCHARGE:                                HISTORY & PHYSICAL   [name] is a 65 year old patient referred for outpatient catheterization  for Dr. Tomie China.   Unfortunately, the patient was approximately 2 hours late for his procedure.  I met the patient last week when he had a cardiac MRI.  The patient had been  complaining of increasing exertional dyspnea.  I believe he had an  outpatient Myoview which showed an anterior wall infarct.  Dr. Tomie China  referred the patient here for a viability study to see if the anterior wall  was viable.  He had a nonviable anterior wall and apex with an EF of 32%.  I  recommended heart catheterization for the patient in regards to possible  need for an AICD in regards to ischemic cardiomyopathy.  The patient has not  had chest pain.  He has had dyspnea for about a year and a half.   His cardiac risk factors include family history of coronary disease,  hypertension, hypercholesterolemia.  Unfortunately, he does not know his  current medications.  He is taking an aspirin a day and some cholesterol and  blood pressure medicines.  His past surgical history includes appendectomy  and hernia repair.   He is happily married.  He lives in [place].  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   EXAMINATION:  VITAL SIGNS:  The blood pressure is 130/70, pulse was 73 and  regular.  LUNGS:  Clear.  CARDIOVASCULAR:  Carotids normal.  There is an S1 and S2 with normal heart  tones.  ABDOMEN:  Benign.  LOWER EXTREMITIES:  Intact distal pulses, no edema.  SKIN:  He has his previous appendectomy and hernia scars.   Lab work drawn when the patient arrived included a  creatinine of 0.9,  potassium of 4.0, hematocrit of 41, platelet count of 228.  Coags were  normal.   IMPRESSION:  Silent anterior wall myocardial infarction with ischemic  cardiomyopathy.  Need to define anatomy further in regards to preservation  of left ventricular function and need for automatic implanted cardioverter  defibrillator.   I will see if Dr. Ladona Ridgel from our electrophysiology service can see the  patient after the catheterization.   I had previously discussed the risks of catheterization with the patient and  his family.  Although his English is not excellent, he seems to understand.           ______________________________  Charlton Haws, M.D.     PN/MEDQ  D:  12/30/2005  T:  12/30/2005  Job:  [id number]   cc:   Aundra Dubin. Revankar, M.D.  Fax: [phone]

## 2011-03-20 ENCOUNTER — Other Ambulatory Visit: Payer: Self-pay | Admitting: *Deleted

## 2011-03-20 MED ORDER — RAMIPRIL 5 MG PO CAPS: 5.0000 mg | ORAL_CAPSULE | Freq: Every day | ORAL | Status: DC

## 2011-03-20 MED ORDER — NEBIVOLOL HCL 10 MG PO TABS: 10.0000 mg | ORAL_TABLET | Freq: Every day | ORAL | Status: DC

## 2011-04-22 ENCOUNTER — Encounter: Payer: Self-pay | Admitting: Internal Medicine

## 2011-04-29 ENCOUNTER — Ambulatory Visit (INDEPENDENT_AMBULATORY_CARE_PROVIDER_SITE_OTHER): Payer: Medicaid Other | Admitting: Internal Medicine

## 2011-04-29 ENCOUNTER — Encounter: Payer: Self-pay | Admitting: Internal Medicine

## 2011-04-29 VITALS — BP 158/98 | HR 70 | Ht 65.0 in | Wt 164.0 lb

## 2011-04-29 DIAGNOSIS — I2589 Other forms of chronic ischemic heart disease: Secondary | ICD-10-CM

## 2011-04-29 DIAGNOSIS — Z01818 Encounter for other preprocedural examination: Secondary | ICD-10-CM

## 2011-04-29 DIAGNOSIS — I495 Sick sinus syndrome: Secondary | ICD-10-CM

## 2011-04-29 DIAGNOSIS — I10 Essential (primary) hypertension: Secondary | ICD-10-CM

## 2011-04-29 DIAGNOSIS — I509 Heart failure, unspecified: Secondary | ICD-10-CM

## 2011-04-29 DIAGNOSIS — Z95810 Presence of automatic (implantable) cardiac defibrillator: Secondary | ICD-10-CM

## 2011-04-29 HISTORY — DX: Encounter for other preprocedural examination: Z01.818

## 2011-04-29 NOTE — Assessment & Plan Note (Signed)
His device is working normally. He has had one ICD shock which appears to be for SVT. I recommended a period of watchful waiting.

## 2011-04-29 NOTE — Patient Instructions (Signed)
Your physician recommends that you schedule a follow-up appointment in: 3 months with device clinic and 12 months with Dr Taylor  

## 2011-04-29 NOTE — Assessment & Plan Note (Signed)
His blood pressure today is elevated. He admits to some indiscretion with medications and with sodium. I have recommended to him that he maintain a low-sodium diet and take his medicines regularly.

## 2011-04-29 NOTE — Assessment & Plan Note (Signed)
The patient is fairly sedentary but has ambulated and off recently without symptoms to justify allowing him to proceed with shoulder surgery as needed. He is low risk for major cardiovascular complications.

## 2011-04-29 NOTE — Progress Notes (Signed)
HPI [name] returns today after a long absence from our EP clinic. He is a pleasant middle-age man with an ischemic cardiomyopathy and chronic systolic heart failure. He spends some of his time in his native [place]. The patient notes that he had an ICD shock several months ago but did not seek medical attention. The patient has class II congestive heart failure symptoms. He denies palpitations. The patient has recently developed right shoulder pain and has undergone evaluation and is considering surgery. The patient is fairly sedentary but is able to walk without much in the way of limitation. He has no anginal symptoms. No Known Allergies   Current Outpatient Prescriptions  Medication Sig Dispense Refill  . fenofibrate 160 MG tablet Take 160 mg by mouth daily. With a meal       . glimepiride (AMARYL) 4 MG tablet Take 4 mg by mouth daily.        . metFORMIN (GLUCOPHAGE) 850 MG tablet Take 850 mg by mouth 2 (two) times daily.        . nebivolol (BYSTOLIC) 10 MG tablet Take 1 tablet (10 mg total) by mouth daily.  30 tablet  2  . ramipril (ALTACE) 5 MG capsule Take 1 capsule (5 mg total) by mouth daily.  30 capsule  2  . simvastatin (ZOCOR) 40 MG tablet Take 40 mg by mouth every other day.          Past Medical History  Diagnosis Date  . Congestive heart failure     unspecified  . Sick sinus syndrome     tachy-brday syndrome  . HTN (hypertension)     unspecified  . HLD (hyperlipidemia)     mixed  . CAD (coronary artery disease)     unspec site  . Cardiomyopathy, ischemic     ROS:   All systems reviewed and negative except as noted in the HPI.   Past Surgical History  Procedure Date  . Icd 5/07    for cardiomyopathy- St Jude Atlas 2 VR     No family history on file.   History   Social History  . Marital Status: Married    Spouse Name: N/A    Number of Children: N/A  . Years of Education: N/A   Occupational History  . Not on file.   Social History Main Topics    . Smoking status: Former Smoker    Types: Cigarettes    Quit date: 04/29/2003  . Smokeless tobacco: Never Used   Comment: tobacco use- no   . Alcohol Use: No  . Drug Use: No  . Sexually Active: Not on file   Other Topics Concern  . Not on file   Social History Narrative  . No narrative on file     BP 158/98  Pulse 70  Ht 5\' 5"  (1.651 m)  Wt 164 lb (74.39 kg)  BMI 27.29 kg/m2  Physical Exam:  Well appearing NAD HEENT: Unremarkable Neck:  No JVD, no thyromegally Lymphatics:  No adenopathy Back:  No CVA tenderness Lungs:  Clear. Well-healed ICD incision. HEART:  Regular rate rhythm, no murmurs, no rubs, no clicks Abd:  soft, positive bowel sounds, no organomegally, no rebound, no guarding Ext:  2 plus pulses, no edema, no cyanosis, no clubbing Skin:  No rashes no nodules Neuro:  CN II through XII intact, motor grossly intact  EKG Normal sinus rhythm with old anterior MI. DEVICE  Normal device function.  See PaceArt for details.   Assess/Plan:

## 2011-06-06 ENCOUNTER — Telehealth: Payer: Self-pay | Admitting: *Deleted

## 2011-06-06 DIAGNOSIS — T82198A Other mechanical complication of other cardiac electronic device, initial encounter: Secondary | ICD-10-CM

## 2011-06-06 NOTE — Telephone Encounter (Signed)
Checking lead 

## 2011-07-10 ENCOUNTER — Other Ambulatory Visit: Payer: Self-pay

## 2011-07-10 MED ORDER — NEBIVOLOL HCL 10 MG PO TABS: 10.0000 mg | ORAL_TABLET | Freq: Every day | ORAL | Status: DC

## 2011-07-10 MED ORDER — RAMIPRIL 5 MG PO CAPS: 5.0000 mg | ORAL_CAPSULE | Freq: Every day | ORAL | Status: DC

## 2011-07-30 ENCOUNTER — Encounter: Payer: Medicaid Other | Admitting: *Deleted

## 2011-10-29 ENCOUNTER — Ambulatory Visit (INDEPENDENT_AMBULATORY_CARE_PROVIDER_SITE_OTHER): Payer: Medicaid Other | Admitting: *Deleted

## 2011-10-29 ENCOUNTER — Encounter: Payer: Self-pay | Admitting: Internal Medicine

## 2011-10-29 DIAGNOSIS — Z95810 Presence of automatic (implantable) cardiac defibrillator: Secondary | ICD-10-CM

## 2011-10-29 DIAGNOSIS — I495 Sick sinus syndrome: Secondary | ICD-10-CM

## 2011-10-29 DIAGNOSIS — I2589 Other forms of chronic ischemic heart disease: Secondary | ICD-10-CM

## 2011-10-29 LAB — ICD DEVICE OBSERVATION
BATTERY VOLTAGE: 2.55 V
CHARGE TIME: 13 s
HV IMPEDENCE: 62 Ohm
RV LEAD IMPEDENCE ICD: 305 Ohm
RV LEAD THRESHOLD: 0.5 V
TOT-0007: 2
TOT-0010: 46
TZAT-0001SLOWVT: 1
TZAT-0004FASTVT: 8
TZAT-0004SLOWVT: 8
TZAT-0012FASTVT: 200 ms
TZAT-0012SLOWVT: 200 ms
TZAT-0013SLOWVT: 4
TZAT-0018SLOWVT: NEGATIVE
TZAT-0019FASTVT: 7.5 V
TZAT-0020FASTVT: 1 ms
TZON-0003FASTVT: 285 ms
TZON-0004FASTVT: 12
TZON-0005FASTVT: 6
TZON-0005SLOWVT: 6
TZST-0001FASTVT: 2
TZST-0001FASTVT: 3
TZST-0001FASTVT: 4
TZST-0001FASTVT: 5
TZST-0001SLOWVT: 2
TZST-0001SLOWVT: 4
TZST-0001SLOWVT: 5
TZST-0003FASTVT: 36 J
TZST-0003FASTVT: 36 J
TZST-0003FASTVT: 36 J
TZST-0003SLOWVT: 36 J
TZST-0003SLOWVT: 36 J

## 2011-10-29 NOTE — Progress Notes (Signed)
defib check in clinic  

## 2011-12-05 ENCOUNTER — Encounter: Payer: Self-pay | Admitting: Internal Medicine

## 2012-08-17 ENCOUNTER — Telehealth: Payer: Self-pay | Admitting: Internal Medicine

## 2012-08-17 ENCOUNTER — Encounter: Payer: Self-pay | Admitting: Internal Medicine

## 2012-08-17 NOTE — Telephone Encounter (Signed)
08-17-12 pt's phone doesn't accept incoming calls, will send past due letter/mt

## 2012-09-08 ENCOUNTER — Telehealth: Payer: Self-pay | Admitting: Internal Medicine

## 2012-09-08 NOTE — Telephone Encounter (Signed)
09-08-12 mail rtn on past due letter, pt's number disconnected/mt

## 2012-11-25 ENCOUNTER — Encounter: Payer: Self-pay | Admitting: Cardiology

## 2012-11-25 ENCOUNTER — Encounter: Payer: Self-pay | Admitting: Internal Medicine

## 2012-11-25 ENCOUNTER — Ambulatory Visit (INDEPENDENT_AMBULATORY_CARE_PROVIDER_SITE_OTHER): Payer: Medicare Other | Admitting: Cardiology

## 2012-11-25 VITALS — BP 122/74 | HR 93 | Ht 65.0 in | Wt 157.0 lb

## 2012-11-25 DIAGNOSIS — Z95810 Presence of automatic (implantable) cardiac defibrillator: Secondary | ICD-10-CM

## 2012-11-25 LAB — ICD DEVICE OBSERVATION
DEVICE MODEL ICD: 353050
PACEART VT: 0
RV LEAD IMPEDENCE ICD: 365 Ohm
TZAT-0001SLOWVT: 1
TZAT-0004FASTVT: 8
TZAT-0004SLOWVT: 8
TZAT-0013FASTVT: 2
TZAT-0013SLOWVT: 4
TZAT-0018FASTVT: NEGATIVE
TZAT-0018SLOWVT: NEGATIVE
TZAT-0019FASTVT: 7.5 V
TZAT-0020FASTVT: 1 ms
TZON-0005FASTVT: 6
TZST-0001FASTVT: 4
TZST-0003FASTVT: 25 J
TZST-0003FASTVT: 36 J
TZST-0003FASTVT: 36 J
TZST-0003FASTVT: 36 J
TZST-0003SLOWVT: 25 J
TZST-0003SLOWVT: 36 J
TZST-0003SLOWVT: 36 J
VF: 0

## 2012-11-25 NOTE — Progress Notes (Signed)
ELECTROPHYSIOLOGY OFFICE NOTE  Patient ID: [name] MRN: [id number], DOB/AGE: [birth year]   Date of Visit: 11/25/2012  Primary Physician: None Primary Cardiologist: Tomie China, MD Reason for Visit: Overdue EP/device follow-up  History of Present Illness  [name] is a pleasant 67 year old man with an ischemic CM s/p single chamber ICD implantation, chronic systolic HF, CAD and HTN presents today for routine electrophysiology followup. Since last being seen by Dr. Ladona Ridgel in August 2012, he reports he is doing well and has no complaints. He denies chest pain or shortness of breath. He denies palpitations, dizziness, near syncope or syncope. He denies LE swelling, orthopnea, PND or recent weight gain. He reports he is compliant and is tolerating medications without difficulty.  Past Medical History Past Medical History  Diagnosis Date  . Congestive heart failure     unspecified  . Sick sinus syndrome     tachy-brday syndrome  . HTN (hypertension)     unspecified  . HLD (hyperlipidemia)     mixed  . CAD (coronary artery disease)     unspec site  . Cardiomyopathy, ischemic     Past Surgical History Past Surgical History  Procedure Laterality Date  . Icd  5/07    for cardiomyopathy- St Jude Atlas 2 VR     Allergies/Intolerances No Known Allergies  Current Home Medications Current Outpatient Prescriptions  Medication Sig Dispense Refill  . fenofibrate 160 MG tablet Take 160 mg by mouth daily. With a meal       . glimepiride (AMARYL) 4 MG tablet Take 4 mg by mouth daily.        . metFORMIN (GLUCOPHAGE) 850 MG tablet Take 850 mg by mouth 2 (two) times daily.        . simvastatin (ZOCOR) 40 MG tablet Take 40 mg by mouth every other day.       . nebivolol (BYSTOLIC) 10 MG tablet Take 1 tablet (10 mg total) by mouth daily.  30 tablet  3  . ramipril (ALTACE) 5 MG capsule Take 1 capsule (5 mg total) by mouth daily.  30 capsule  3   Social History Social History  . Marital  Status: Married   Social History Main Topics  . Smoking status: Former Smoker    Types: Cigarettes    Quit date: 04/29/2003  . Smokeless tobacco: Never Used     Comment: tobacco use- no   . Alcohol Use: No  . Drug Use: No   Review of Systems General: No chills, fever, night sweats or weight changes Cardiovascular: No chest pain, dyspnea on exertion, edema, orthopnea, palpitations, paroxysmal nocturnal dyspnea Dermatological: No rash, lesions or masses Respiratory: No cough, dyspnea Urologic: No hematuria, dysuria Abdominal: No nausea, vomiting, diarrhea, bright red blood per rectum, melena, or hematemesis Neurologic: No visual changes, weakness, changes in mental status All other systems reviewed and are otherwise negative except as noted above.  Physical Exam Blood pressure 122/74, pulse 93, height 5\' 5"  (1.651 m), weight 157 lb (71.215 kg), SpO2 98.00%.  General: Well developed, well appearing 67 year old male in no acute distress. HEENT: Normocephalic, atraumatic. EOMs intact. Sclera nonicteric. Oropharynx clear.  Neck: Supple without bruits. No JVD. Lungs: Respirations regular and unlabored, CTA bilaterally. No wheezes, rales or rhonchi. Heart: RRR. S1, S2 present. No murmurs, rub, S3 or S4. Abdomen: Soft, non-distended. Extremities: No clubbing, cyanosis or edema. DP/PT/Radials 2+ and equal bilaterally. Psych: Normal affect. Neuro: Alert and oriented X 3. Moves all extremities spontaneously.  Diagnostics Device interrogation performed today - normal ICD function with good battery status and stable lead measurements/parameters; his battery is nearing ERI; no episodes; no programming changes made; see PaceArt report  Assessment and Plan 1. Ischemic CM s/p ICD implant Normal device function No programming changes made Return for ICD battery check in 1 month Counseled regarding importance of compliance and follow-up 2. Chronic systolic HF Stable; euvolemic by  exam Continue medical therapy and follow-up with Dr. Tomie China 3. CAD Stable without anginal symptoms Continue medical therapy, risk factor modification/secondary prevention and follow-up with Dr. Tomie China  Signed, EDMISTEN, BROOKE, PA-C 11/25/2012, 3:36 PM

## 2012-11-25 NOTE — Patient Instructions (Addendum)
Your physician recommends that you schedule a follow-up appointment in: 1 month with Device Clinic for a battery check

## 2012-12-27 ENCOUNTER — Ambulatory Visit (INDEPENDENT_AMBULATORY_CARE_PROVIDER_SITE_OTHER): Payer: Medicare Other | Admitting: *Deleted

## 2012-12-27 ENCOUNTER — Other Ambulatory Visit: Payer: Self-pay | Admitting: Internal Medicine

## 2012-12-27 DIAGNOSIS — I495 Sick sinus syndrome: Secondary | ICD-10-CM

## 2012-12-27 DIAGNOSIS — I2589 Other forms of chronic ischemic heart disease: Secondary | ICD-10-CM

## 2012-12-27 DIAGNOSIS — I509 Heart failure, unspecified: Secondary | ICD-10-CM

## 2012-12-27 LAB — ICD DEVICE OBSERVATION
BATTERY VOLTAGE: 2.51 V
BRDY-0002RV: 40 {beats}/min
DEVICE MODEL ICD: 353050
RV LEAD AMPLITUDE: 5.9 mv
RV LEAD THRESHOLD: 1.25 V
TOT-0008: 0
TOT-0010: 52
TZAT-0001FASTVT: 1
TZAT-0004FASTVT: 8
TZAT-0012FASTVT: 200 ms
TZAT-0012SLOWVT: 200 ms
TZAT-0013SLOWVT: 4
TZAT-0018SLOWVT: NEGATIVE
TZAT-0019SLOWVT: 7.5 V
TZAT-0020SLOWVT: 1 ms
TZON-0004FASTVT: 12
TZON-0005SLOWVT: 6
TZST-0001FASTVT: 3
TZST-0001FASTVT: 5
TZST-0001SLOWVT: 2
TZST-0003FASTVT: 25 J
TZST-0003FASTVT: 36 J
TZST-0003FASTVT: 36 J
TZST-0003SLOWVT: 36 J

## 2012-12-27 NOTE — Progress Notes (Signed)
ICD check 

## 2013-01-19 ENCOUNTER — Telehealth: Payer: Self-pay | Admitting: Internal Medicine

## 2013-01-19 NOTE — Telephone Encounter (Signed)
Tried to call several times, number not a working number

## 2013-01-19 NOTE — Telephone Encounter (Signed)
New Prob    Pt has some questions regarding defib. Would like to speak to nurse.

## 2013-01-20 ENCOUNTER — Encounter: Payer: Self-pay | Admitting: Internal Medicine

## 2013-01-20 NOTE — Telephone Encounter (Signed)
Tried again today and number still not working

## 2013-01-24 NOTE — Telephone Encounter (Signed)
Follow up   Pt stated this is his correct telephone # [phone],.

## 2013-01-24 NOTE — Telephone Encounter (Signed)
Called the number again, still just getting a fast busy.  Please overhead page me when patient calls back

## 2013-01-25 NOTE — Telephone Encounter (Signed)
FAST BUSY SIGNAL ONCE AGAIN./ Y

## 2013-01-25 NOTE — Telephone Encounter (Signed)
Follow-up:    Patient called in with several question's regarding his defibrillator.  Please call back.

## 2013-01-26 NOTE — Telephone Encounter (Signed)
Will forward to the device clinic for follow up.

## 2013-01-26 NOTE — Telephone Encounter (Signed)
Unable to reach pt. Overhead page device clinic if pt calls back in/kwm

## 2013-01-31 ENCOUNTER — Ambulatory Visit (INDEPENDENT_AMBULATORY_CARE_PROVIDER_SITE_OTHER): Payer: Medicare Other | Admitting: *Deleted

## 2013-01-31 ENCOUNTER — Other Ambulatory Visit: Payer: Self-pay | Admitting: Internal Medicine

## 2013-01-31 DIAGNOSIS — I495 Sick sinus syndrome: Secondary | ICD-10-CM

## 2013-01-31 LAB — ICD DEVICE OBSERVATION
BATTERY VOLTAGE: 2.53 V
CHARGE TIME: 13.5 s
DEVICE MODEL ICD: 353050
RV LEAD IMPEDENCE ICD: 365 Ohm
TZAT-0004FASTVT: 8
TZAT-0004SLOWVT: 8
TZAT-0012FASTVT: 200 ms
TZAT-0013FASTVT: 2
TZAT-0018SLOWVT: NEGATIVE
TZAT-0020FASTVT: 1 ms
TZON-0005SLOWVT: 6
TZST-0001FASTVT: 4
TZST-0001FASTVT: 5
TZST-0001SLOWVT: 3
TZST-0001SLOWVT: 4
TZST-0003FASTVT: 25 J
TZST-0003FASTVT: 36 J
TZST-0003FASTVT: 36 J
TZST-0003SLOWVT: 15 J
TZST-0003SLOWVT: 25 J
TZST-0003SLOWVT: 36 J

## 2013-01-31 MED ORDER — RAMIPRIL 5 MG PO CAPS: 5.0000 mg | ORAL_CAPSULE | Freq: Every day | ORAL | Status: DC

## 2013-01-31 MED ORDER — SIMVASTATIN 40 MG PO TABS: 40.0000 mg | ORAL_TABLET | ORAL | Status: DC

## 2013-01-31 MED ORDER — NEBIVOLOL HCL 10 MG PO TABS: 10.0000 mg | ORAL_TABLET | Freq: Every day | ORAL | Status: DC

## 2013-01-31 NOTE — Progress Notes (Addendum)
icd check in clinic for battery. Battery voltage 2.53 V. ERI 2.45 V. Per SJM battery longevity 4 to 6 months till ERI. Alert vibratory demonstrated and pt aware to call office if felt. Refills sent in by [facility]. ROV in 2 months w/device clinic.

## 2013-01-31 NOTE — Addendum Note (Signed)
Addended by: Donalynn Furlong on: 01/31/2013 10:43 AM   Modules accepted: Orders

## 2013-04-25 ENCOUNTER — Encounter: Payer: Self-pay | Admitting: Internal Medicine

## 2013-04-25 ENCOUNTER — Ambulatory Visit (INDEPENDENT_AMBULATORY_CARE_PROVIDER_SITE_OTHER): Payer: Medicare Other | Admitting: *Deleted

## 2013-04-25 DIAGNOSIS — I2589 Other forms of chronic ischemic heart disease: Secondary | ICD-10-CM

## 2013-04-25 LAB — ICD DEVICE OBSERVATION
BATTERY VOLTAGE: 2.5 V
CHARGE TIME: 13.6 s
RV LEAD IMPEDENCE ICD: 355 Ohm
TOT-0007: 2
TOT-0008: 0
TOT-0009: 3
TZAT-0001SLOWVT: 1
TZAT-0012FASTVT: 200 ms
TZAT-0013FASTVT: 2
TZAT-0018FASTVT: NEGATIVE
TZAT-0020SLOWVT: 1 ms
TZON-0003FASTVT: 285 ms
TZST-0001FASTVT: 2
TZST-0001FASTVT: 3
TZST-0001FASTVT: 4
TZST-0001FASTVT: 5
TZST-0001SLOWVT: 3
TZST-0001SLOWVT: 5
TZST-0003FASTVT: 25 J
TZST-0003SLOWVT: 15 J
TZST-0003SLOWVT: 25 J
TZST-0003SLOWVT: 36 J
VENTRICULAR PACING ICD: 1 pct

## 2013-04-25 NOTE — Progress Notes (Signed)
Battery check only in office.

## 2013-06-30 ENCOUNTER — Encounter: Payer: Self-pay | Admitting: Internal Medicine

## 2013-06-30 ENCOUNTER — Encounter: Payer: Self-pay | Admitting: *Deleted

## 2013-06-30 ENCOUNTER — Ambulatory Visit (INDEPENDENT_AMBULATORY_CARE_PROVIDER_SITE_OTHER): Payer: Medicare Other | Admitting: Internal Medicine

## 2013-06-30 VITALS — BP 170/103 | HR 103 | Ht 65.0 in | Wt 172.6 lb

## 2013-06-30 DIAGNOSIS — I255 Ischemic cardiomyopathy: Secondary | ICD-10-CM

## 2013-06-30 DIAGNOSIS — I2589 Other forms of chronic ischemic heart disease: Secondary | ICD-10-CM

## 2013-06-30 DIAGNOSIS — I509 Heart failure, unspecified: Secondary | ICD-10-CM

## 2013-06-30 DIAGNOSIS — Z95810 Presence of automatic (implantable) cardiac defibrillator: Secondary | ICD-10-CM

## 2013-06-30 LAB — ICD DEVICE OBSERVATION
CHARGE TIME: 13.6 s
DEVICE MODEL ICD: 353050
RV LEAD IMPEDENCE ICD: 375 Ohm
TOT-0006: 20140512000000
TOT-0009: 3
TOT-0010: 54
VENTRICULAR PACING ICD: 1 pct

## 2013-06-30 LAB — CBC WITH DIFFERENTIAL/PLATELET
Basophils Absolute: 0 10*3/uL (ref 0.0–0.1)
Basophils Relative: 0.5 % (ref 0.0–3.0)
Eosinophils Absolute: 0.4 10*3/uL (ref 0.0–0.7)
Eosinophils Relative: 4.6 % (ref 0.0–5.0)
Hemoglobin: 15.6 g/dL (ref 13.0–17.0)
Monocytes Absolute: 0.8 10*3/uL (ref 0.1–1.0)
Neutro Abs: 4.4 10*3/uL (ref 1.4–7.7)
Neutrophils Relative %: 50 % (ref 43.0–77.0)
RBC: 5.46 Mil/uL (ref 4.22–5.81)
WBC: 8.8 10*3/uL (ref 4.5–10.5)

## 2013-06-30 LAB — BASIC METABOLIC PANEL
BUN: 11 mg/dL (ref 6–23)
CO2: 26 mEq/L (ref 19–32)
Calcium: 9.5 mg/dL (ref 8.4–10.5)
Chloride: 101 mEq/L (ref 96–112)
Potassium: 4.2 mEq/L (ref 3.5–5.1)

## 2013-06-30 NOTE — Assessment & Plan Note (Signed)
His St. Jude ICD has reached elective replacement. We discussed the treatment options with the patient including the risk, goals, benefits, and expectations of ICD generator removal and insertion of a new device. He wishes to proceed. This be scheduled in the next several weeks.

## 2013-06-30 NOTE — Assessment & Plan Note (Signed)
He denies anginal symptoms. No change in medical therapy. I've encouraged the patient to increase his physical activity and lose weight.

## 2013-06-30 NOTE — Patient Instructions (Signed)
Will have your ICD generator replaced on 07/18/13  See instruction sheet

## 2013-06-30 NOTE — Progress Notes (Signed)
      HPI [name] returns today for ongoing evaluation and management of his ICD. The patient has an ischemic cardiomyopathy and chronic class 2 systolic heart failure. His ejection fraction is 30%. He underwent initial ICD implantation in 2007. He has reached elective replacement on his current device. He has had no episodes of syncope or recent ICD shock. He denies chest pain. No shortness of breath except with exertion. He denies peripheral edema. No Known Allergies   Current Outpatient Prescriptions  Medication Sig Dispense Refill  . fenofibrate 160 MG tablet Take 160 mg by mouth daily. With a meal       . glimepiride (AMARYL) 4 MG tablet Take 4 mg by mouth daily.        . metFORMIN (GLUCOPHAGE) 850 MG tablet Take 850 mg by mouth 2 (two) times daily.        . nebivolol (BYSTOLIC) 10 MG tablet Take 1 tablet (10 mg total) by mouth daily.  90 tablet  1  . ramipril (ALTACE) 5 MG capsule Take 1 capsule (5 mg total) by mouth daily.  90 capsule  1  . simvastatin (ZOCOR) 40 MG tablet Take 1 tablet (40 mg total) by mouth every other day.  90 tablet  1   No current facility-administered medications for this visit.     Past Medical History  Diagnosis Date  . Congestive heart failure     unspecified  . Sick sinus syndrome     tachy-brday syndrome  . HTN (hypertension)     unspecified  . HLD (hyperlipidemia)     mixed  . CAD (coronary artery disease)     unspec site  . Cardiomyopathy, ischemic     ROS:   All systems reviewed and negative except as noted in the HPI.   Past Surgical History  Procedure Laterality Date  . Icd  5/07    for cardiomyopathy- St Jude Atlas 2 VR     No family history on file.   History   Social History  . Marital Status: Married    Spouse Name: N/A    Number of Children: N/A  . Years of Education: N/A   Occupational History  . Not on file.   Social History Main Topics  . Smoking status: Former Smoker    Types: Cigarettes    Quit date:  04/29/2003  . Smokeless tobacco: Never Used     Comment: tobacco use- no   . Alcohol Use: No  . Drug Use: No  . Sexual Activity: Not on file   Other Topics Concern  . Not on file   Social History Narrative  . No narrative on file     BP 170/103  Pulse 103  Ht 5' 5" (1.651 m)  Wt 172 lb 9.6 oz (78.291 kg)  BMI 28.72 kg/m2  Physical Exam:  Well appearing middle-aged man, NAD HEENT: Unremarkable Neck:  No JVD, no thyromegally Back:  No CVA tenderness Lungs:  Clear with no wheezes, rales, or rhonchi. HEART:  Regular rate rhythm, no murmurs, no rubs, no clicks Abd:  soft, positive bowel sounds, no organomegally, no rebound, no guarding Ext:  2 plus pulses, no edema, no cyanosis, no clubbing Skin:  No rashes no nodules Neuro:  CN II through XII intact, motor grossly intact   DEVICE  Normal device function.  See PaceArt for details. Device at elective replacement  Assess/Plan: 

## 2013-07-13 ENCOUNTER — Other Ambulatory Visit: Payer: Self-pay

## 2013-07-13 MED ORDER — SIMVASTATIN 40 MG PO TABS: 40.0000 mg | ORAL_TABLET | ORAL | Status: DC

## 2013-07-15 ENCOUNTER — Encounter (HOSPITAL_COMMUNITY): Payer: Self-pay | Admitting: Pharmacy Technician

## 2013-07-17 MED ORDER — SODIUM CHLORIDE 0.9 % IV SOLN
INTRAVENOUS | Status: DC
  Administered 2013-07-18: 07:00:00 via INTRAVENOUS

## 2013-07-17 MED ORDER — CEFAZOLIN SODIUM-DEXTROSE 2-3 GM-% IV SOLR
2.0000 g | INTRAVENOUS | Status: DC
  Filled 2013-07-17: qty 50

## 2013-07-17 MED ORDER — SODIUM CHLORIDE 0.9 % IR SOLN
80.0000 mg | Status: DC
  Filled 2013-07-17 (×2): qty 2

## 2013-07-17 MED ORDER — CHLORHEXIDINE GLUCONATE 4 % EX LIQD
60.0000 mL | Freq: Once | CUTANEOUS | Status: DC
  Filled 2013-07-17: qty 60

## 2013-07-18 ENCOUNTER — Encounter (HOSPITAL_COMMUNITY)
Admission: RE | Disposition: A | Discharge status: Home / Self Care | Payer: Self-pay | Source: Ambulatory Visit | Attending: Internal Medicine

## 2013-07-18 ENCOUNTER — Ambulatory Visit (HOSPITAL_COMMUNITY)
Admission: RE | Admit: 2013-07-18 | Discharge: 2013-07-18 | Disposition: A | Discharge status: Home / Self Care | Payer: Medicare Other | Source: Ambulatory Visit | Attending: Internal Medicine | Admitting: Internal Medicine

## 2013-07-18 DIAGNOSIS — I2589 Other forms of chronic ischemic heart disease: Secondary | ICD-10-CM | POA: Insufficient documentation

## 2013-07-18 DIAGNOSIS — I255 Ischemic cardiomyopathy: Secondary | ICD-10-CM

## 2013-07-18 DIAGNOSIS — Z4502 Encounter for adjustment and management of automatic implantable cardiac defibrillator: Secondary | ICD-10-CM | POA: Insufficient documentation

## 2013-07-18 DIAGNOSIS — I495 Sick sinus syndrome: Secondary | ICD-10-CM | POA: Insufficient documentation

## 2013-07-18 DIAGNOSIS — I509 Heart failure, unspecified: Secondary | ICD-10-CM

## 2013-07-18 DIAGNOSIS — I2510 Atherosclerotic heart disease of native coronary artery without angina pectoris: Secondary | ICD-10-CM | POA: Insufficient documentation

## 2013-07-18 DIAGNOSIS — I5022 Chronic systolic (congestive) heart failure: Secondary | ICD-10-CM | POA: Insufficient documentation

## 2013-07-18 DIAGNOSIS — I10 Essential (primary) hypertension: Secondary | ICD-10-CM | POA: Insufficient documentation

## 2013-07-18 DIAGNOSIS — E785 Hyperlipidemia, unspecified: Secondary | ICD-10-CM | POA: Insufficient documentation

## 2013-07-18 HISTORY — PX: IMPLANTABLE CARDIOVERTER DEFIBRILLATOR (ICD) GENERATOR CHANGE: SHX5469

## 2013-07-18 LAB — BASIC METABOLIC PANEL
CO2: 29 mEq/L (ref 19–32)
Chloride: 98 mEq/L (ref 96–112)
Sodium: 136 mEq/L (ref 135–145)

## 2013-07-18 LAB — CBC
Hemoglobin: 15.7 g/dL (ref 13.0–17.0)
MCV: 85.3 fL (ref 78.0–100.0)
Platelets: 166 10*3/uL (ref 150–400)
RBC: 5.38 MIL/uL (ref 4.22–5.81)
WBC: 7.4 10*3/uL (ref 4.0–10.5)

## 2013-07-18 LAB — GLUCOSE, CAPILLARY: Glucose-Capillary: 171 mg/dL — ABNORMAL HIGH (ref 70–99)

## 2013-07-18 SURGERY — ICD GENERATOR CHANGE
Anesthesia: LOCAL

## 2013-07-18 MED ORDER — MUPIROCIN 2 % EX OINT
TOPICAL_OINTMENT | CUTANEOUS | Status: AC
  Filled 2013-07-18: qty 22

## 2013-07-18 MED ORDER — LIDOCAINE HCL (PF) 1 % IJ SOLN
INTRAMUSCULAR | Status: AC
  Filled 2013-07-18: qty 60

## 2013-07-18 MED ORDER — HEPARIN (PORCINE) IN NACL 2-0.9 UNIT/ML-% IJ SOLN
INTRAMUSCULAR | Status: AC
  Filled 2013-07-18: qty 500

## 2013-07-18 MED ORDER — ONDANSETRON HCL 4 MG/2ML IJ SOLN: 4.0000 mg | Freq: Four times a day (QID) | INTRAMUSCULAR | Status: DC | PRN

## 2013-07-18 MED ORDER — MIDAZOLAM HCL 5 MG/5ML IJ SOLN
INTRAMUSCULAR | Status: AC
  Filled 2013-07-18: qty 5

## 2013-07-18 MED ORDER — MUPIROCIN 2 % EX OINT
TOPICAL_OINTMENT | Freq: Two times a day (BID) | CUTANEOUS | Status: DC
  Administered 2013-07-18: 1 via NASAL
  Filled 2013-07-18: qty 22

## 2013-07-18 MED ORDER — ACETAMINOPHEN 325 MG PO TABS
325.0000 mg | ORAL_TABLET | ORAL | Status: DC | PRN
  Filled 2013-07-18: qty 2

## 2013-07-18 MED ORDER — FENTANYL CITRATE 0.05 MG/ML IJ SOLN
INTRAMUSCULAR | Status: AC
  Filled 2013-07-18: qty 2

## 2013-07-18 NOTE — Interval H&P Note (Signed)
History and Physical Interval Note:Patient seen and examined. Agree with above. No change in the history, physical exam, assessment and plan.  07/18/2013 7:55 AM  [name]  has presented today for surgery, with the diagnosis of End of life  The various methods of treatment have been discussed with the patient and family. After consideration of risks, benefits and other options for treatment, the patient has consented to  Procedure(s): ICD GENERATOR CHANGE (N/A) as a surgical intervention .  The patient's history has been reviewed, patient examined, no change in status, stable for surgery.  I have reviewed the patient's chart and labs.  Questions were answered to the patient's satisfaction.     [name].D.

## 2013-07-18 NOTE — CV Procedure (Signed)
Electrophysiology procedure note  Procedure: Removal of a previous implanted ICD, and insertion of a new ICD, with defibrillation threshold testing.  Indication: Ischemic cardiomyopathy, chronic systolic heart failure, class II, ejection fraction 30%, status post prior ICD implantation with the open device at elective replacement  Findings: After informed consent was obtained, the patient was taken to the diagnostic electrophysiology laboratory in the fasting state. After the usual preparation and draping, intravenous fentanyl and Versed was given for sedation. 30 cc of lidocaine was injected into the left infraclavicular region near the previous ICD insertion site. A 7 cm incision was carried out over this region, and electrocautery was utilized to dissect down to the ICD pocket. The ICD pocket was entered with electrocautery. The dense fibrous adhesions were freed up with electrocautery. The generator was removed in total. The lead was evaluated and found to be working satisfactorily. R waves were 10, and a pacing impedance was 365 ohms. Threshold was 1 V at 0.5 ms. With the satisfactory parameters, the new St. Jude Ellipse VR single chamber ICD was connected to the old lead and placed back in the subcutaneous pocket. Antibiotic irrigation was irrigated into the pocket. The incision was closed with 2 layers of Vicryl suture. Benzoin and Steri-Strips for pain on the skin. At this point, I scrubbed out of the procedure and supervised defibrillation threshold testing.  Under my direct supervision, the patient was sedated with additional fentanyl and Versed. Ventricular fibrillation was induced with a T-wave shock. A 20 J shock was subsequently delivered which terminated ventricular fibrillation and restore sinus rhythm. Sensing was appropriate. The shocking impedance was 66 ohms. At this point the patient was returned to the recovery area in satisfactory condition.  Complications: There were no immediate  complications.  Conclusion: This demonstrate successful removal of a previous implanted St. Jude ICD which had reached elective replacement, followed by insertion of a new ICD with defibrillation threshold testing.  Lewayne Bunting, M.D.

## 2013-07-18 NOTE — H&P (Signed)
  ICD Criteria  Current LVEF (within 6 months):30%  NYHA Functional Classification: Class II  Heart Failure History:  Yes, Duration of heart failure since onset is > 9 months  Non-Ischemic Dilated Cardiomyopathy History:  No.  Atrial Fibrillation/Atrial Flutter:  No.  Ventricular Tachycardia History:  No.  Cardiac Arrest History:  No  History of Syndromes with Risk of Sudden Death:  No.  Previous ICD:  yes  Electrophysiology Study: No.  Anticoagulation Therapy:  Patient is NOT on anticoagulation therapy.   Beta Blocker Therapy:  Yes.   Ace Inhibitor/ARB Therapy:  Yes.

## 2013-07-18 NOTE — H&P (View-Only) (Signed)
      HPI [name] returns today for ongoing evaluation and management of his ICD. The patient has an ischemic cardiomyopathy and chronic class 2 systolic heart failure. His ejection fraction is 30%. He underwent initial ICD implantation in 2007. He has reached elective replacement on his current device. He has had no episodes of syncope or recent ICD shock. He denies chest pain. No shortness of breath except with exertion. He denies peripheral edema. No Known Allergies   Current Outpatient Prescriptions  Medication Sig Dispense Refill  . fenofibrate 160 MG tablet Take 160 mg by mouth daily. With a meal       . glimepiride (AMARYL) 4 MG tablet Take 4 mg by mouth daily.        . metFORMIN (GLUCOPHAGE) 850 MG tablet Take 850 mg by mouth 2 (two) times daily.        . nebivolol (BYSTOLIC) 10 MG tablet Take 1 tablet (10 mg total) by mouth daily.  90 tablet  1  . ramipril (ALTACE) 5 MG capsule Take 1 capsule (5 mg total) by mouth daily.  90 capsule  1  . simvastatin (ZOCOR) 40 MG tablet Take 1 tablet (40 mg total) by mouth every other day.  90 tablet  1   No current facility-administered medications for this visit.     Past Medical History  Diagnosis Date  . Congestive heart failure     unspecified  . Sick sinus syndrome     tachy-brday syndrome  . HTN (hypertension)     unspecified  . HLD (hyperlipidemia)     mixed  . CAD (coronary artery disease)     unspec site  . Cardiomyopathy, ischemic     ROS:   All systems reviewed and negative except as noted in the HPI.   Past Surgical History  Procedure Laterality Date  . Icd  5/07    for cardiomyopathy- St Jude Atlas 2 VR     No family history on file.   History   Social History  . Marital Status: Married    Spouse Name: N/A    Number of Children: N/A  . Years of Education: N/A   Occupational History  . Not on file.   Social History Main Topics  . Smoking status: Former Smoker    Types: Cigarettes    Quit date:  04/29/2003  . Smokeless tobacco: Never Used     Comment: tobacco use- no   . Alcohol Use: No  . Drug Use: No  . Sexual Activity: Not on file   Other Topics Concern  . Not on file   Social History Narrative  . No narrative on file     BP 170/103  Pulse 103  Ht 5\' 5"  (1.651 m)  Wt 172 lb 9.6 oz (78.291 kg)  BMI 28.72 kg/m2  Physical Exam:  Well appearing middle-aged man, NAD HEENT: Unremarkable Neck:  No JVD, no thyromegally Back:  No CVA tenderness Lungs:  Clear with no wheezes, rales, or rhonchi. HEART:  Regular rate rhythm, no murmurs, no rubs, no clicks Abd:  soft, positive bowel sounds, no organomegally, no rebound, no guarding Ext:  2 plus pulses, no edema, no cyanosis, no clubbing Skin:  No rashes no nodules Neuro:  CN II through XII intact, motor grossly intact   DEVICE  Normal device function.  See PaceArt for details. Device at elective replacement  Assess/Plan:

## 2013-07-25 ENCOUNTER — Telehealth: Payer: Self-pay

## 2013-07-25 NOTE — Telephone Encounter (Signed)
Follow Up  2nd call  Pt calling asks when should he remove the bandages from his chest// or should he come in// please advise

## 2013-07-25 NOTE — Telephone Encounter (Signed)
I spoke with patient's son, who states he was never given any post up instructions for wound care or any follow up visit.   It is one week out and he still has the large bandage on.  I have instructed him to remove that bandage and have transferred him to [facility] to have his worked into schedule for wound check

## 2013-07-25 NOTE — Telephone Encounter (Signed)
New problem     Patient calling, When should the bandage come off his chest.

## 2013-07-26 ENCOUNTER — Ambulatory Visit (INDEPENDENT_AMBULATORY_CARE_PROVIDER_SITE_OTHER): Payer: Medicare Other | Admitting: *Deleted

## 2013-07-26 DIAGNOSIS — I2589 Other forms of chronic ischemic heart disease: Secondary | ICD-10-CM

## 2013-07-26 LAB — ICD DEVICE OBSERVATION
BRDY-0002RV: 40 {beats}/min
DEV-0020ICD: NEGATIVE
DEVICE MODEL ICD: 7138508
FVT: 0
HV IMPEDENCE: 70 Ohm
PACEART VT: 0
RV LEAD AMPLITUDE: 9.1 mv
RV LEAD IMPEDENCE ICD: 350 Ohm
RV LEAD THRESHOLD: 1.25 V
TOT-0007: 1
TOT-0008: 0
TOT-0009: 0
TOT-0010: 1
TZAT-0001FASTVT: 1
TZAT-0001SLOWVT: 1
TZAT-0004FASTVT: 8
TZAT-0004SLOWVT: 8
TZAT-0012FASTVT: 200 ms
TZAT-0012SLOWVT: 200 ms
TZAT-0013FASTVT: 2
TZAT-0013SLOWVT: 4
TZAT-0018FASTVT: NEGATIVE
TZAT-0018SLOWVT: NEGATIVE
TZAT-0019FASTVT: 7.5 V
TZAT-0019SLOWVT: 7.5 V
TZAT-0020FASTVT: 1 ms
TZAT-0020SLOWVT: 1 ms
TZON-0003FASTVT: 285 ms
TZON-0003SLOWVT: 330 ms
TZON-0004FASTVT: 20
TZON-0004SLOWVT: 30
TZON-0005FASTVT: 6
TZON-0005SLOWVT: 6
TZON-0010FASTVT: 40 ms
TZON-0010SLOWVT: 40 ms
TZST-0001FASTVT: 2
TZST-0001FASTVT: 3
TZST-0001FASTVT: 4
TZST-0001FASTVT: 5
TZST-0001SLOWVT: 2
TZST-0001SLOWVT: 3
TZST-0001SLOWVT: 4
TZST-0001SLOWVT: 5
TZST-0003FASTVT: 20 J
TZST-0003FASTVT: 30 J
TZST-0003FASTVT: 36 J
TZST-0003FASTVT: 36 J
TZST-0003SLOWVT: 20 J
TZST-0003SLOWVT: 30 J
TZST-0003SLOWVT: 36 J
VENTRICULAR PACING ICD: 0 pct
VF: 0

## 2013-07-26 MED ORDER — NEBIVOLOL HCL 10 MG PO TABS: 10.0000 mg | ORAL_TABLET | Freq: Every day | ORAL | Status: DC

## 2013-07-26 MED ORDER — RAMIPRIL 5 MG PO CAPS: 5.0000 mg | ORAL_CAPSULE | Freq: Every day | ORAL | Status: DC

## 2013-07-26 MED ORDER — FENOFIBRATE 160 MG PO TABS: 160.0000 mg | ORAL_TABLET | Freq: Every day | ORAL | Status: DC

## 2013-07-26 NOTE — Progress Notes (Signed)
Wound check appointment. Steri-strips removed. Wound without redness or edema. Incision edges approximated, wound well healed. Normal device function. Thresholds, sensing, and impedances consistent with implant measurements. Device programmed at 3.5V for extra safety margin until 3 month visit. Histogram distribution appropriate for patient and level of activity. No mode switches or ventricular arrhythmias noted. Patient educated about wound care, arm mobility, lifting restrictions, shock plan. Merlin 10/27/13.

## 2013-08-03 ENCOUNTER — Encounter: Payer: Self-pay | Admitting: Internal Medicine

## 2013-10-26 ENCOUNTER — Encounter: Payer: Self-pay | Admitting: Internal Medicine

## 2013-10-26 ENCOUNTER — Ambulatory Visit (INDEPENDENT_AMBULATORY_CARE_PROVIDER_SITE_OTHER): Payer: Medicare Other | Admitting: Internal Medicine

## 2013-10-26 ENCOUNTER — Encounter: Payer: Medicare Other | Admitting: *Deleted

## 2013-10-26 VITALS — BP 163/97 | HR 86 | Ht 65.0 in | Wt 168.8 lb

## 2013-10-26 DIAGNOSIS — I5022 Chronic systolic (congestive) heart failure: Secondary | ICD-10-CM

## 2013-10-26 DIAGNOSIS — I2589 Other forms of chronic ischemic heart disease: Secondary | ICD-10-CM

## 2013-10-26 DIAGNOSIS — Z95810 Presence of automatic (implantable) cardiac defibrillator: Secondary | ICD-10-CM

## 2013-10-26 DIAGNOSIS — I4729 Other ventricular tachycardia: Secondary | ICD-10-CM

## 2013-10-26 DIAGNOSIS — I472 Ventricular tachycardia, unspecified: Secondary | ICD-10-CM

## 2013-10-26 DIAGNOSIS — I255 Ischemic cardiomyopathy: Secondary | ICD-10-CM

## 2013-10-26 HISTORY — DX: Ventricular tachycardia, unspecified: I47.20

## 2013-10-26 LAB — MDC_IDC_ENUM_SESS_TYPE_INCLINIC
Battery Remaining Longevity: 96 mo
Brady Statistic RV Percent Paced: 0 %
Date Time Interrogation Session: 20150204211429
HighPow Impedance: 70.4691
Lead Channel Impedance Value: 325 Ohm
Lead Channel Pacing Threshold Amplitude: 1.25 V
Lead Channel Pacing Threshold Amplitude: 1.25 V
Lead Channel Pacing Threshold Pulse Width: 0.5 ms
Lead Channel Pacing Threshold Pulse Width: 0.5 ms
Lead Channel Setting Pacing Amplitude: 2.5 V
Lead Channel Setting Pacing Pulse Width: 0.5 ms
MDC IDC MSMT LEADCHNL RV SENSING INTR AMPL: 8.5 mV
MDC IDC PG SERIAL: 7138508
MDC IDC SET LEADCHNL RV SENSING SENSITIVITY: 0.5 mV
MDC IDC SET ZONE DETECTION INTERVAL: 285 ms
MDC IDC SET ZONE DETECTION INTERVAL: 350 ms
Zone Setting Detection Interval: 250 ms

## 2013-10-26 NOTE — Assessment & Plan Note (Signed)
His device is working normally. He will followup in several months. See programming changes as above. I have asked the patient not to drive.

## 2013-10-26 NOTE — Assessment & Plan Note (Signed)
The patient has had his first episode of sustained VT. His device failed ATP and shocked him to a slower VT which fell below the detection rate. It resolved spontaneously. I have reprogrammed the device. His first shock has been increased in output. He has had his detection rate lowered.

## 2013-10-26 NOTE — Patient Instructions (Signed)
Your physician recommends that you continue on your current medications as directed. Please refer to the Current Medication list given to you today.  Remote monitoring is used to monitor your Pacemaker of ICD from home. This monitoring reduces the number of office visits required to check your device to one time per year. It allows us to keep an eye on the functioning of your device to ensure it is working properly. You are scheduled for a device check from home on 01/27/14. You may send your transmission at any time that day. If you have a wireless device, the transmission will be sent automatically. After your physician reviews your transmission, you will receive a postcard with your next transmission date.  Your physician wants you to follow-up in: 9 months with Dr. Ladona Ridgelaylor.  You will receive a reminder letter in the mail two months in advance. If you don't receive a letter, please call our office to schedule the follow-up appointment.

## 2013-10-26 NOTE — Progress Notes (Signed)
HPI [name] returns today for ongoing evaluation and management of his ICD. The patient has an ischemic cardiomyopathy and chronic class 2 systolic heart failure. His ejection fraction is 30%. He underwent initial ICD implantation in 2007. He has undergone ICD generator removal and insertion of a new device. He has had an ICD shock during prayers several weeks ago. He denies chest pain. No shortness of breath except with exertion. He denies peripheral edema. No Known Allergies   Current Outpatient Prescriptions  Medication Sig Dispense Refill  . fenofibrate 160 MG tablet Take 1 tablet (160 mg total) by mouth daily. With a meal  90 tablet  0  . gentamicin (GARAMYCIN) 0.3 % ophthalmic solution Apply to eye.      Marland Kitchen glimepiride (AMARYL) 4 MG tablet Take 4 mg by mouth daily.        . metFORMIN (GLUCOPHAGE) 850 MG tablet Take 850 mg by mouth 2 (two) times daily.        . nebivolol (BYSTOLIC) 10 MG tablet Take 1 tablet (10 mg total) by mouth daily.  90 tablet  1  . ramipril (ALTACE) 5 MG capsule Take 1 capsule (5 mg total) by mouth daily.  90 capsule  1  . simvastatin (ZOCOR) 40 MG tablet Take 1 tablet (40 mg total) by mouth every other day.  90 tablet  1   No current facility-administered medications for this visit.     Past Medical History  Diagnosis Date  . Congestive heart failure     unspecified  . Sick sinus syndrome     tachy-brday syndrome  . HTN (hypertension)     unspecified  . HLD (hyperlipidemia)     mixed  . CAD (coronary artery disease)     unspec site  . Cardiomyopathy, ischemic     ROS:   All systems reviewed and negative except as noted in the HPI.   Past Surgical History  Procedure Laterality Date  . Icd  5/07    for cardiomyopathy- St Jude Atlas 2 VR     No family history on file.   History   Social History  . Marital Status: Married    Spouse Name: N/A    Number of Children: N/A  . Years of Education: N/A   Occupational History  . Not  on file.   Social History Main Topics  . Smoking status: Former Smoker    Types: Cigarettes    Quit date: 04/29/2003  . Smokeless tobacco: Never Used     Comment: tobacco use- no   . Alcohol Use: No  . Drug Use: No  . Sexual Activity: Not on file   Other Topics Concern  . Not on file   Social History Narrative  . No narrative on file     BP 163/97  Pulse 86  Ht 5\' 5"  (1.651 m)  Wt 168 lb 12.8 oz (76.567 kg)  BMI 28.09 kg/m2  Physical Exam:  Well appearing middle-aged man, NAD HEENT: Unremarkable Neck:  No JVD, no thyromegally Back:  No CVA tenderness Lungs:  Clear with no wheezes, rales, or rhonchi. HEART:  Regular rate rhythm, no murmurs, no rubs, no clicks Abd:  soft, positive bowel sounds, no organomegally, no rebound, no guarding Ext:  2 plus pulses, no edema, no cyanosis, no clubbing Skin:  No rashes no nodules Neuro:  CN II through XII intact, motor grossly intact   DEVICE  Normal device function.  See PaceArt for details. Device  at elective replacement  Assess/Plan:

## 2014-01-26 ENCOUNTER — Encounter: Payer: Medicare Other | Admitting: *Deleted

## 2014-02-03 ENCOUNTER — Encounter: Payer: Self-pay | Admitting: Internal Medicine

## 2014-02-03 ENCOUNTER — Encounter: Payer: Medicare Other | Admitting: *Deleted

## 2014-02-03 ENCOUNTER — Ambulatory Visit (INDEPENDENT_AMBULATORY_CARE_PROVIDER_SITE_OTHER): Payer: Medicare Other | Admitting: Internal Medicine

## 2014-02-03 DIAGNOSIS — I472 Ventricular tachycardia, unspecified: Secondary | ICD-10-CM

## 2014-02-03 DIAGNOSIS — I2589 Other forms of chronic ischemic heart disease: Secondary | ICD-10-CM

## 2014-02-03 DIAGNOSIS — I4729 Other ventricular tachycardia: Secondary | ICD-10-CM

## 2014-02-03 LAB — MDC_IDC_ENUM_SESS_TYPE_INCLINIC
Battery Remaining Longevity: 91.2 mo
Brady Statistic RV Percent Paced: 0 %
HighPow Impedance: 73.1011
Lead Channel Impedance Value: 350 Ohm
Lead Channel Setting Pacing Amplitude: 2.5 V
Lead Channel Setting Pacing Pulse Width: 0.5 ms
Lead Channel Setting Sensing Sensitivity: 0.5 mV
MDC IDC MSMT LEADCHNL RV SENSING INTR AMPL: 10.7 mV
MDC IDC PG SERIAL: 7138508
MDC IDC SESS DTM: 20150515130737
MDC IDC SET ZONE DETECTION INTERVAL: 250 ms
MDC IDC SET ZONE DETECTION INTERVAL: 285 ms
Zone Setting Detection Interval: 350 ms

## 2014-02-03 LAB — BASIC METABOLIC PANEL
BUN: 12 mg/dL (ref 6–23)
CHLORIDE: 103 meq/L (ref 96–112)
CO2: 26 mEq/L (ref 19–32)
CREATININE: 0.9 mg/dL (ref 0.4–1.5)
Calcium: 9.5 mg/dL (ref 8.4–10.5)
GFR: 93.74 mL/min (ref >60.00)
Glucose, Bld: 125 mg/dL — ABNORMAL HIGH (ref 70–99)
Potassium: 4.2 mEq/L (ref 3.5–5.1)
SODIUM: 139 meq/L (ref 135–145)

## 2014-02-03 LAB — MAGNESIUM: MAGNESIUM: 2.1 mg/dL (ref 1.5–2.5)

## 2014-02-03 NOTE — Progress Notes (Signed)
      Patient Care Team: Treasa SchoolPhillip Land, PA-C as PCP - General (Physician Assistant)   HPI  [name] is a 68 y.o. male Seen because of ICD discharge. Review of the electrogram suggested this was ventricular in origin. He has had prior ICD [facility]    Past Medical History  Diagnosis Date  . Congestive heart failure     unspecified  . Sick sinus syndrome     tachy-brday syndrome  . HTN (hypertension)     unspecified  . HLD (hyperlipidemia)     mixed  . CAD (coronary artery disease)     unspec site  . Cardiomyopathy, ischemic     Past Surgical History  Procedure Laterality Date  . Icd  5/07    for cardiomyopathy- St Jude Atlas 2 VR    Current Outpatient Prescriptions  Medication Sig Dispense Refill  . fenofibrate 160 MG tablet Take 1 tablet (160 mg total) by mouth daily. With a meal  90 tablet  0  . gentamicin (GARAMYCIN) 0.3 % ophthalmic solution Apply to eye.      Marland Kitchen. glimepiride (AMARYL) 4 MG tablet Take 4 mg by mouth daily.        . metFORMIN (GLUCOPHAGE) 850 MG tablet Take 850 mg by mouth 2 (two) times daily.        . nebivolol (BYSTOLIC) 10 MG tablet Take 1 tablet (10 mg total) by mouth daily.  90 tablet  1  . ramipril (ALTACE) 5 MG capsule Take 1 capsule (5 mg total) by mouth daily.  90 capsule  1  . simvastatin (ZOCOR) 40 MG tablet Take 1 tablet (40 mg total) by mouth every other day.  90 tablet  1   No current facility-administered medications for this visit.    No Known Allergies  Review of Systems negative except from HPI and PMH  Physical Exam There were no vitals taken for this visit. 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 Will undertake Myoview scanning to look for reversible cause. We'll also check electrolytes.  These are reviewed and these are normal  We will begin him on amiodarone if neither of the aforementioned tests are abnormal. He'll need to be considered also for catheter ablation

## 2014-02-03 NOTE — Patient Instructions (Addendum)
Your physician recommends that you continue on your current medications as directed. Please refer to the Current Medication list given to you today.  Labs today: BMET, Magnesium  Your physician recommends that you schedule a follow-up appointment in: 3-4 weeks with Dr. Ladona Ridgelaylor   Your physician has requested that you have an exercise tolerance test. For further information please visit [email or web address] Please also follow instruction sheet, as given.   Exercise Stress Electrocardiogram An exercise stress electrocardiogram is a test to check how blood flows to your heart. It is done to find areas of poor blood flow. You will need to walk on a treadmill for this test. The electrocardiogram will record your heartbeat when you are at rest and when you are exercising. BEFORE THE PROCEDURE  Do not have drinks with caffeine or foods with caffeine for 24 hours before the test, or as told by your doctor.  Follow your doctor's instructions about eating and drinking before the test.  Ask your doctor what medicines you should or should not take before the test. Take your medicines with water unless told by your doctor not to.  If you use an inhaler, bring it with you to the test.  Bring a snack to eat after the test.  Do not  smoke for 4 hours before the test.  Wear comfortable shoes and clothing. PROCEDURE  You will have patches put on your chest. Small areas of your chest may need to be shaved. Wires will be connected to the patches.  Your heart rate will be watched while you are resting and while you are exercising.  You will walk on the treadmill. The treadmill will slowly get faster to raise your heart rate.  The test will take about 1 2 hours. AFTER THE PROCEDURE  Your heart rate and blood pressure will be watched after the test.  You may return to your normal diet, activities, and medicines or as told by your doctor. Document Released: 02/25/2008 Document Revised: 06/29/2013  Document Reviewed: 05/16/2013 [facility] Patient Information 2014 [employer] [place].

## 2014-02-07 ENCOUNTER — Ambulatory Visit (HOSPITAL_COMMUNITY)
Admission: RE | Admit: 2014-02-07 | Discharge: 2014-02-07 | Disposition: A | Discharge status: Home / Self Care | Payer: Medicare Other | Source: Ambulatory Visit | Attending: Internal Medicine | Admitting: Internal Medicine

## 2014-02-07 DIAGNOSIS — I472 Ventricular tachycardia, unspecified: Secondary | ICD-10-CM | POA: Insufficient documentation

## 2014-02-07 DIAGNOSIS — I4729 Other ventricular tachycardia: Secondary | ICD-10-CM | POA: Insufficient documentation

## 2014-02-22 ENCOUNTER — Other Ambulatory Visit: Payer: Self-pay

## 2014-02-23 ENCOUNTER — Encounter: Payer: Self-pay | Admitting: Internal Medicine

## 2014-02-23 ENCOUNTER — Ambulatory Visit (INDEPENDENT_AMBULATORY_CARE_PROVIDER_SITE_OTHER): Payer: Medicare Other | Admitting: Internal Medicine

## 2014-02-23 VITALS — BP 166/98 | HR 104 | Ht 65.0 in | Wt 173.0 lb

## 2014-02-23 DIAGNOSIS — Z95810 Presence of automatic (implantable) cardiac defibrillator: Secondary | ICD-10-CM

## 2014-02-23 DIAGNOSIS — I472 Ventricular tachycardia, unspecified: Secondary | ICD-10-CM

## 2014-02-23 DIAGNOSIS — I509 Heart failure, unspecified: Secondary | ICD-10-CM

## 2014-02-23 DIAGNOSIS — I2589 Other forms of chronic ischemic heart disease: Secondary | ICD-10-CM

## 2014-02-23 DIAGNOSIS — I4729 Other ventricular tachycardia: Secondary | ICD-10-CM

## 2014-02-23 LAB — MDC_IDC_ENUM_SESS_TYPE_INCLINIC
Battery Remaining Longevity: 91.2 mo
Brady Statistic RV Percent Paced: 0 %
HighPow Impedance: 69.2661
Implantable Pulse Generator Serial Number: 7138508
Lead Channel Impedance Value: 325 Ohm
Lead Channel Pacing Threshold Amplitude: 1.25 V
Lead Channel Pacing Threshold Amplitude: 1.25 V
Lead Channel Pacing Threshold Pulse Width: 0.5 ms
Lead Channel Pacing Threshold Pulse Width: 0.5 ms
Lead Channel Sensing Intrinsic Amplitude: 7 mV
Lead Channel Setting Pacing Amplitude: 2.5 V
Lead Channel Setting Pacing Pulse Width: 0.5 ms
Lead Channel Setting Sensing Sensitivity: 0.5 mV
MDC IDC SESS DTM: 20150604105042
MDC IDC SET ZONE DETECTION INTERVAL: 250 ms
MDC IDC SET ZONE DETECTION INTERVAL: 350 ms
Zone Setting Detection Interval: 285 ms

## 2014-02-23 MED ORDER — NEBIVOLOL HCL 10 MG PO TABS: 10.0000 mg | ORAL_TABLET | Freq: Every day | ORAL | Status: DC

## 2014-02-23 MED ORDER — AMIODARONE HCL 200 MG PO TABS: 200.0000 mg | ORAL_TABLET | Freq: Every day | ORAL | Status: DC

## 2014-02-23 NOTE — Patient Instructions (Addendum)
Your physician wants you to follow-up in: 6 months with Dr Court Joy will receive a reminder letter in the mail two months in advance. If you don't receive a letter, please call our office to schedule the follow-up appointment.  Remote monitoring is used to monitor your Pacemaker or ICD from home. This monitoring reduces the number of office visits required to check your device to one time per year. It allows [place] to keep an eye on the functioning of your device to ensure it is working properly. You are scheduled for a device check from home on 05/30/14. You may send your transmission at any time that day. If you have a wireless device, the transmission will be sent automatically. After your physician reviews your transmission, you will receive a postcard with your next transmission date.   Your physician has recommended you make the following change in your medication:  1) Start Amiodarone 200mg  daily 2) Start Bystolic 10mg  daily

## 2014-02-23 NOTE — Assessment & Plan Note (Signed)
He denies anginal symptoms or worsening CHF. Will follow.

## 2014-02-23 NOTE — Assessment & Plan Note (Signed)
Today he will restart his beta blocker and amiodarone 200 mg daily. I will see him back in 6 months. He is traveling out of the country.

## 2014-02-23 NOTE — Progress Notes (Signed)
HPI Mr. Ellerbe returns today for ongoing evaluation and management of his ICD. The patient has an ischemic cardiomyopathy and chronic class 2 systolic heart failure. His ejection fraction is 30%. He underwent initial ICD implantation in 2007. He has undergone ICD generator removal and insertion of a new device. He has had an ICD shock during prayers several months ago and has had several additional shocks. These appear to be due to VT. He denies any warning about the event. He has many concerns today and I have spent 45 minutes discussing them with him. These include but are not limited to the function of the device, his meds, chest pain over the device, his ability to go to the [facility], why he has heart racing. I have done my best to answer all of his questions.  No Known Allergies   Current Outpatient Prescriptions  Medication Sig Dispense Refill  . fenofibrate 160 MG tablet Take 1 tablet (160 mg total) by mouth daily. With a meal  90 tablet  0  . gentamicin (GARAMYCIN) 0.3 % ophthalmic solution Apply to eye.      Marland Kitchen glimepiride (AMARYL) 4 MG tablet Take 4 mg by mouth daily.        . metFORMIN (GLUCOPHAGE) 850 MG tablet Take 850 mg by mouth 2 (two) times daily.        . ramipril (ALTACE) 5 MG capsule Take 1 capsule (5 mg total) by mouth daily.  90 capsule  1  . simvastatin (ZOCOR) 40 MG tablet Take 1 tablet (40 mg total) by mouth every other day.  90 tablet  1   No current facility-administered medications for this visit.     Past Medical History  Diagnosis Date  . Congestive heart failure     unspecified  . Sick sinus syndrome     tachy-brday syndrome  . HTN (hypertension)     unspecified  . HLD (hyperlipidemia)     mixed  . CAD (coronary artery disease)     unspec site  . Cardiomyopathy, ischemic     ROS:   All systems reviewed and negative except as noted in the HPI.   Past Surgical History  Procedure Laterality Date  . Icd  5/07    for cardiomyopathy- St Jude  Atlas 2 VR     No family history on file.   History   Social History  . Marital Status: Married    Spouse Name: N/A    Number of Children: N/A  . Years of Education: N/A   Occupational History  . Not on file.   Social History Main Topics  . Smoking status: Former Smoker    Types: Cigarettes    Quit date: 04/29/2003  . Smokeless tobacco: Never Used     Comment: tobacco use- no   . Alcohol Use: No  . Drug Use: No  . Sexual Activity: Not on file   Other Topics Concern  . Not on file   Social History Narrative  . No narrative on file     BP 166/98  Pulse 104  Ht 5\' 5"  (1.651 m)  Wt 173 lb (78.472 kg)  BMI 28.79 kg/m2  Physical Exam:  Well appearing middle-aged man, NAD HEENT: Unremarkable Neck:  No JVD, no thyromegally Back:  No CVA tenderness Lungs:  Clear with no wheezes, rales, or rhonchi. HEART:  Regular tachy rhythm, no murmurs, no rubs, no clicks Abd:  soft, positive bowel sounds, no organomegally, no rebound, no guarding  Ext:  2 plus pulses, no edema, no cyanosis, no clubbing Skin:  No rashes no nodules Neuro:  CN II through XII intact, motor grossly intact   DEVICE  Normal device function.  See PaceArt for details.   Assess/Plan:

## 2014-02-23 NOTE — Assessment & Plan Note (Signed)
His device is functioning normally. Will recheck in several months.

## 2014-05-30 ENCOUNTER — Telehealth: Payer: Self-pay | Admitting: Internal Medicine

## 2014-05-30 ENCOUNTER — Encounter: Payer: Medicare Other | Admitting: *Deleted

## 2014-05-30 NOTE — Telephone Encounter (Signed)
New message      Pt is trying to check his device from home but is having trouble.  Please call

## 2014-05-30 NOTE — Telephone Encounter (Signed)
Attempted to instructed pt how to send manual transmission. Pt has [employer] cable for his phone but he stated that he has a filter. After several minutes and transmission still had not send I informed pt to call tech services. Pt verbalized understanding.

## 2014-06-02 ENCOUNTER — Encounter: Payer: Self-pay | Admitting: Cardiology

## 2014-08-01 ENCOUNTER — Encounter: Payer: Self-pay | Admitting: Cardiology

## 2014-08-08 ENCOUNTER — Other Ambulatory Visit: Payer: Self-pay | Admitting: Internal Medicine

## 2014-08-31 ENCOUNTER — Encounter (HOSPITAL_COMMUNITY): Payer: Self-pay | Admitting: Internal Medicine

## 2014-10-18 ENCOUNTER — Ambulatory Visit (INDEPENDENT_AMBULATORY_CARE_PROVIDER_SITE_OTHER): Payer: Medicare Other | Admitting: *Deleted

## 2014-10-18 DIAGNOSIS — Z95810 Presence of automatic (implantable) cardiac defibrillator: Secondary | ICD-10-CM | POA: Diagnosis not present

## 2014-10-18 DIAGNOSIS — I472 Ventricular tachycardia, unspecified: Secondary | ICD-10-CM

## 2014-10-18 LAB — MDC_IDC_ENUM_SESS_TYPE_INCLINIC
Battery Remaining Longevity: 92.4 mo
Brady Statistic RV Percent Paced: 0 %
HIGH POWER IMPEDANCE MEASURED VALUE: 75 Ohm
Lead Channel Impedance Value: 325 Ohm
Lead Channel Pacing Threshold Pulse Width: 0.7 ms
Lead Channel Pacing Threshold Pulse Width: 0.7 ms
Lead Channel Sensing Intrinsic Amplitude: 5.9 mV
Lead Channel Setting Pacing Amplitude: 2.5 V
Lead Channel Setting Pacing Pulse Width: 0.7 ms
Lead Channel Setting Sensing Sensitivity: 0.5 mV
MDC IDC MSMT LEADCHNL RV PACING THRESHOLD AMPLITUDE: 1.25 V
MDC IDC MSMT LEADCHNL RV PACING THRESHOLD AMPLITUDE: 1.25 V
MDC IDC PG SERIAL: 7138508
MDC IDC SESS DTM: 20160127164951
Zone Setting Detection Interval: 250 ms
Zone Setting Detection Interval: 285 ms
Zone Setting Detection Interval: 350 ms

## 2014-10-18 NOTE — Progress Notes (Signed)
ICD check in clinic. Normal device function. Threshold and sensing consistent with previous device measurements. Impedance trends stable over time. No evidence of any ventricular arrhythmias. Histogram distribution appropriate for patient and level of activity. CorVue abnormal 09/12/14 x7d. Device programmed at appropriate safety margins---changed pulse width from 0.5 to 0.407ms. Device programmed to optimize intrinsic conduction. Estimated longevity 7.2-7.7640yrs. Merlin 01/17/15 & ROV w/ Dr. Ladona Ridgelaylor in 12mo.

## 2014-10-26 ENCOUNTER — Encounter: Payer: Self-pay | Admitting: Internal Medicine

## 2015-01-17 ENCOUNTER — Telehealth: Payer: Self-pay | Admitting: Internal Medicine

## 2015-01-17 ENCOUNTER — Telehealth: Payer: Self-pay | Admitting: Cardiology

## 2015-01-17 ENCOUNTER — Encounter: Payer: Medicare Other | Admitting: *Deleted

## 2015-01-17 NOTE — Telephone Encounter (Signed)
Follow Up   1. Has your device fired?   2. Is you device beeping?   3. Are you experiencing draining or swelling at device site?   4. Are you calling to see if we received your device transmission? Yes  5. Have you passed out?   Pt is not sure is the signal was recieved

## 2015-01-17 NOTE — Telephone Encounter (Signed)
Spoke w/ pt and informed him that we have not received a transmission since 2009. Gave pt tech service to receive help trouble shooting monitor. Pt verbalized understanding.

## 2015-01-17 NOTE — Telephone Encounter (Signed)
Spoke with pt and reminded pt of remote transmission that is due today. Pt verbalized understanding.   

## 2015-01-18 ENCOUNTER — Encounter: Payer: Self-pay | Admitting: Cardiology

## 2015-01-25 ENCOUNTER — Encounter: Payer: Self-pay | Admitting: Internal Medicine

## 2015-01-25 ENCOUNTER — Ambulatory Visit (INDEPENDENT_AMBULATORY_CARE_PROVIDER_SITE_OTHER): Payer: Medicare Other | Admitting: *Deleted

## 2015-01-25 ENCOUNTER — Telehealth: Payer: Self-pay | Admitting: *Deleted

## 2015-01-25 DIAGNOSIS — I255 Ischemic cardiomyopathy: Secondary | ICD-10-CM | POA: Diagnosis not present

## 2015-01-25 NOTE — Telephone Encounter (Signed)
Clarified how to send a manual remote. Remote received. Pt aware to leave Merlin in current configuration for automatic monitoring.

## 2015-01-31 NOTE — Progress Notes (Signed)
Remote ICD transmission.   

## 2015-02-01 LAB — CUP PACEART REMOTE DEVICE CHECK
Battery Remaining Longevity: 86 mo
Battery Remaining Percentage: 84 %
Battery Voltage: 3.01 V
Brady Statistic RV Percent Paced: 1 %
HIGH POWER IMPEDANCE MEASURED VALUE: 68 Ohm
HighPow Impedance: 68 Ohm
Lead Channel Impedance Value: 330 Ohm
Lead Channel Sensing Intrinsic Amplitude: 6.3 mV
Lead Channel Setting Pacing Amplitude: 2.5 V
Lead Channel Setting Sensing Sensitivity: 0.5 mV
MDC IDC MSMT LEADCHNL RV PACING THRESHOLD AMPLITUDE: 1.25 V
MDC IDC MSMT LEADCHNL RV PACING THRESHOLD PULSEWIDTH: 0.7 ms
MDC IDC SESS DTM: 20160505174410
MDC IDC SET LEADCHNL RV PACING PULSEWIDTH: 0.7 ms
MDC IDC SET ZONE DETECTION INTERVAL: 250 ms
Pulse Gen Serial Number: 7138508
Zone Setting Detection Interval: 285 ms
Zone Setting Detection Interval: 350 ms

## 2015-02-09 ENCOUNTER — Encounter: Payer: Self-pay | Admitting: Cardiology

## 2015-03-23 ENCOUNTER — Encounter: Payer: Self-pay | Admitting: *Deleted

## 2015-04-17 ENCOUNTER — Encounter: Payer: Self-pay | Admitting: *Deleted

## 2016-03-11 ENCOUNTER — Other Ambulatory Visit: Payer: Self-pay | Admitting: Orthopedic Surgery

## 2016-03-11 DIAGNOSIS — S82202K Unspecified fracture of shaft of left tibia, subsequent encounter for closed fracture with nonunion: Secondary | ICD-10-CM

## 2016-03-19 ENCOUNTER — Other Ambulatory Visit: Payer: Medicare Other

## 2016-03-27 ENCOUNTER — Ambulatory Visit
Admission: RE | Admit: 2016-03-27 | Discharge: 2016-03-27 | Disposition: A | Discharge status: Home / Self Care | Payer: Medicare Other | Source: Ambulatory Visit | Attending: Orthopedic Surgery | Admitting: Orthopedic Surgery

## 2016-03-27 DIAGNOSIS — S82202K Unspecified fracture of shaft of left tibia, subsequent encounter for closed fracture with nonunion: Secondary | ICD-10-CM

## 2016-04-18 ENCOUNTER — Telehealth: Payer: Self-pay | Admitting: Internal Medicine

## 2016-04-18 NOTE — Telephone Encounter (Signed)
New message        Request for surgical clearance:  1. What type of surgery is being performed? Removing hardware and replacing hardware in the pt leg  When is this surgery scheduled? August 8 th 2. Are there any medications that need to be held prior to surgery and how long?cardiac clerence   3. Name of physician performing surgery? Dr. Carola Frost   4. What is your office phone and fax number? Fax [phone]/office number [phone]    The office needs cardiac clearence

## 2016-04-18 NOTE — Telephone Encounter (Signed)
Spoke with [facility] and let her know patient would need to be seen prior to surgical clearance as he has not been seen in 2 years

## 2016-04-21 NOTE — Telephone Encounter (Signed)
Has an a[pointment 04/23/16

## 2016-04-22 ENCOUNTER — Encounter: Payer: Self-pay | Admitting: Physician Assistant

## 2016-04-23 ENCOUNTER — Encounter: Payer: Medicare Other | Admitting: Physician Assistant

## 2016-04-23 DIAGNOSIS — R0989 Other specified symptoms and signs involving the circulatory and respiratory systems: Secondary | ICD-10-CM

## 2016-04-23 NOTE — Progress Notes (Deleted)
Cardiology Office Note Date:  04/23/2016  Patient ID:  Christopher, Hodges [birth year], MRN [id number] PCP:  Treasa School, PA-C  Cardiologist:  *** Electrophysiologist: Dr. Ladona Ridgel  ***refresh   Chief Complaint: pre-op evaluation  History of Present Illness: [name] is a 70 y.o. male with history of VT w/ICD, ICM, CHF, HTN, HLD, tachy-brady syndrome.  He last saw Dr. Ladona Ridgel in June of 2015, at that time he noted:  "has an ischemic cardiomyopathy and chronic class 2 systolic heart failure. His ejection fraction is 30%. He underwent initial ICD implantation in 2007. He has undergone ICD generator removal and insertion of a new device. He has had an ICD shock during prayers several months ago and has had several additional shocks. These appear to be due to VT" His last remote transmission was May 2016 with intact function and no arrhythmias.  He comes in today to be seen for Dr. Ladona Ridgel needing a pre-operative cardiac evaluation.  He has been feeling ***  Device information: SJM single chamber ICD, gen change 07/18/16 Dr. Ladona Ridgel, original device 01/20/06 + history of appropriate shocks for VT AAD therapy with amiodarone chronically 2015  *** symptoms? Syncope? Shocks? *** surgery type? Date? *** general  Cards? *** repeat echo? *** amio surveillence labs *** FAMILY HX *** weight, volume status, corVue *** CAD????  ASA? BB?     Past Medical History:  Diagnosis Date  . CAD (coronary artery disease)    unspec site  . Cardiomyopathy, ischemic   . Congestive heart failure (HCC)    unspecified  . HLD (hyperlipidemia)    mixed  . HTN (hypertension)    unspecified  . Sick sinus syndrome (HCC)    tachy-brday syndrome    Past Surgical History:  Procedure Laterality Date  . icd  5/07   for cardiomyopathy- St Jude Atlas 2 VR  . IMPLANTABLE CARDIOVERTER DEFIBRILLATOR (ICD) GENERATOR CHANGE N/A 07/18/2013   Procedure: ICD GENERATOR CHANGE;  Surgeon: Marinus Maw, MD;  Location[facility]  [facility] CATH LAB;  Service: Cardiovascular;  Laterality: N/A;    Current Outpatient Prescriptions  Medication Sig Dispense Refill  . amiodarone (PACERONE) 200 MG tablet Take 1 tablet (200 mg total) by mouth daily. 90 tablet 3  . fenofibrate 160 MG tablet Take 1 tablet (160 mg total) by mouth daily. With a meal 90 tablet 0  . gentamicin (GARAMYCIN) 0.3 % ophthalmic solution Apply to eye.    Marland Kitchen glimepiride (AMARYL) 4 MG tablet Take 4 mg by mouth daily.      . metFORMIN (GLUCOPHAGE) 850 MG tablet Take 850 mg by mouth 2 (two) times daily.      . nebivolol (BYSTOLIC) 10 MG tablet Take 1 tablet (10 mg total) by mouth daily. 90 tablet 3  . ramipril (ALTACE) 5 MG capsule Take 1 capsule (5 mg total) by mouth daily. 90 capsule 1  . simvastatin (ZOCOR) 40 MG tablet Take 1 tablet (40 mg total) by mouth every other day. 90 tablet 1   No current facility-administered medications for this visit.     Allergies:   Review of patient's allergies indicates no known allergies.   Social History:  The patient  reports that he quit smoking about 12 years ago. His smoking use included Cigarettes. He has never used smokeless tobacco. He reports that he does not drink alcohol or use drugs.   Family History:  The patient's family history is not on file.***  ROS:  Please see the history of present illness.  All other systems are reviewed and otherwise negative.   PHYSICAL EXAM: *** VS:  There were no vitals taken for this visit. BMI: There is no height or weight on file to calculate BMI. Well nourished, well developed, in no acute distress  HEENT: normocephalic, atraumatic  Neck: no JVD, carotid bruits or masses Cardiac:  RRR; no significant murmurs, no rubs, or gallops Lungs:  clear to auscultation bilaterally, no wheezing, rhonchi or rales  Abd: soft, nontender MS: no deformity or atrophy Ext: no edema  Skin: warm and dry, no rash Neuro:  No gross deficits appreciated Psych: euthymic mood, full affect  ***  ICD site is stable, no tethering or discomfort   EKG:  Done today shows *** ICD interrogation today: ***  02/07/14; ETT Conclusions Poor exercise tolerance for age Prolonged HR and BP recovery. No chest pain. THR was achieved. Duke Treadmill Score: +5 No exercise induced ischemic EKG changes noted at given workload.  03/18/06: Echocardiogram SUMMARY - Overall left ventricular systolic function was mildly to    moderately decreased. Left ventricular ejection fraction was    estimated to be 40 %. There was akinesis of the mid-distal    anteroseptal wall. There was akinesis of the apical wall. - Normal right ventricle There was the appearance of a catheter or    pacing wire in the right ventricle. 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 No obvious vegetation.  Recent Labs: No results found for requested labs within last 8760 hours.  No results found for requested labs within last 8760 hours.   CrCl cannot be calculated (Unknown ideal weight.).   Wt Readings from Last 3 Encounters:  02/23/14 173 lb (78.5 kg)  10/26/13 168 lb 12.8 oz (76.6 kg)  07/18/13 171 lb (77.6 kg)     Other studies reviewed: Additional studies/records reviewed today include: summarized above  ASSESSMENT AND PLAN:  1. ICM hx of VT w/ICD    *** normal device function    *** no VT    *** volume status is stable  2. HTN    *** stable  3. CAD    *** on statin  4. Pre-op     ***       Disposition: F/u with ***  Current medicines are reviewed at length with the patient today.  The patient did not have any concerns regarding medicines.***  Signed, Sherrilee Gilles, PA-C 04/23/2016 7:59 AM     [facility] [address] [place] [address] [phone] (office)  [phone] (fax)

## 2016-04-28 ENCOUNTER — Encounter (HOSPITAL_COMMUNITY): Payer: Self-pay | Admitting: Emergency Medicine

## 2016-04-29 ENCOUNTER — Inpatient Hospital Stay (HOSPITAL_COMMUNITY): Admission: RE | Admit: 2016-04-29 | Payer: Medicare Other | Source: Ambulatory Visit | Admitting: Orthopedic Surgery

## 2016-04-29 ENCOUNTER — Encounter (HOSPITAL_COMMUNITY): Admission: RE | Payer: Self-pay | Source: Ambulatory Visit

## 2016-04-29 SURGERY — INSERTION, ANTIBIOTIC IMPREGNATED INTRAMEDULLARY NAIL
Anesthesia: General | Laterality: Left
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
## 2016-05-12 ENCOUNTER — Encounter: Payer: Self-pay | Admitting: Physician Assistant

## 2016-05-12 ENCOUNTER — Ambulatory Visit (INDEPENDENT_AMBULATORY_CARE_PROVIDER_SITE_OTHER): Payer: Medicare Other | Admitting: Physician Assistant

## 2016-05-12 VITALS — BP 140/84 | HR 69 | Ht 65.0 in | Wt 170.0 lb

## 2016-05-12 DIAGNOSIS — Z01818 Encounter for other preprocedural examination: Secondary | ICD-10-CM

## 2016-05-12 DIAGNOSIS — I429 Cardiomyopathy, unspecified: Secondary | ICD-10-CM | POA: Diagnosis not present

## 2016-05-12 DIAGNOSIS — I2510 Atherosclerotic heart disease of native coronary artery without angina pectoris: Secondary | ICD-10-CM

## 2016-05-12 DIAGNOSIS — I472 Ventricular tachycardia, unspecified: Secondary | ICD-10-CM

## 2016-05-12 DIAGNOSIS — I10 Essential (primary) hypertension: Secondary | ICD-10-CM

## 2016-05-12 LAB — HEPATIC FUNCTION PANEL
ALK PHOS: 67 U/L (ref 40–115)
ALT: 14 U/L (ref 9–46)
AST: 20 U/L (ref 10–35)
Albumin: 4.6 g/dL (ref 3.6–5.1)
BILIRUBIN INDIRECT: 0.3 mg/dL (ref 0.2–1.2)
Bilirubin, Direct: 0.1 mg/dL (ref <=0.2)
TOTAL PROTEIN: 7.9 g/dL (ref 6.1–8.1)
Total Bilirubin: 0.4 mg/dL (ref 0.2–1.2)

## 2016-05-12 LAB — BASIC METABOLIC PANEL
BUN: 13 mg/dL (ref 7–25)
CALCIUM: 10.1 mg/dL (ref 8.6–10.3)
CO2: 28 mmol/L (ref 20–31)
CREATININE: 0.96 mg/dL (ref 0.70–1.18)
Chloride: 102 mmol/L (ref 98–110)
GLUCOSE: 134 mg/dL — AB (ref 65–99)
Potassium: 4.6 mmol/L (ref 3.5–5.3)
Sodium: 139 mmol/L (ref 135–146)

## 2016-05-12 LAB — TSH: TSH: 3.8 mIU/L (ref 0.40–4.50)

## 2016-05-12 LAB — MAGNESIUM: Magnesium: 2 mg/dL (ref 1.5–2.5)

## 2016-05-12 MED ORDER — NEBIVOLOL HCL 20 MG PO TABS: 20.0000 mg | ORAL_TABLET | Freq: Every day | ORAL | 1 refills | Status: DC

## 2016-05-12 MED ORDER — NEBIVOLOL HCL 20 MG PO TABS: 20.0000 mg | ORAL_TABLET | Freq: Every day | ORAL | 5 refills | Status: DC

## 2016-05-12 NOTE — Patient Instructions (Addendum)
Medication Instructions:   START TAKING BYSTOLIC 20 MG ONCE A DAY   If you need a refill on your cardiac medications before your next appointment, please call your pharmacy.  Labwork: BMET MAG TSH AND LFT    Testing/Procedures: Your physician has requested that you have an echocardiogram. Echocardiography is a painless test that uses sound waves to create images of your heart. It provides your doctor with information about the size and shape of your heart and how well your heart's chambers and valves are working. This procedure takes approximately one hour. There are no restrictions for this procedure.   Follow-Up: WITH DR Ladona RidgelAYLOR ASAP AFTER ECHO (I F HAVE TO MESSAGE EP SCHEDULER PER URSUY )   Any Other Special Instructions Will Be Listed Below (If Applicable).

## 2016-05-13 ENCOUNTER — Telehealth: Payer: Self-pay | Admitting: *Deleted

## 2016-05-13 NOTE — Telephone Encounter (Signed)
SPOKE TO PATIENT ABOUT STABLE LABS AND TO KEEP ECHO AND GT FOLLOW UP NEXT WEEK

## 2016-05-13 NOTE — Telephone Encounter (Signed)
-----   Message from [facility]enee Lynn Ursuy, [place]-C sent at 05/12/2016  6:09 PM EDT ----- Please let the patient know his labs look OK, continue his medicines and follow up with his echo and Dr. Ladona Ridgelaylor visit next week.

## 2016-05-20 ENCOUNTER — Ambulatory Visit (HOSPITAL_COMMUNITY): Payer: Medicare Other | Attending: Physician Assistant

## 2016-05-20 ENCOUNTER — Ambulatory Visit (INDEPENDENT_AMBULATORY_CARE_PROVIDER_SITE_OTHER): Payer: Medicare Other | Admitting: Internal Medicine

## 2016-05-20 ENCOUNTER — Encounter: Payer: Self-pay | Admitting: Internal Medicine

## 2016-05-20 ENCOUNTER — Other Ambulatory Visit: Payer: Self-pay

## 2016-05-20 ENCOUNTER — Other Ambulatory Visit (HOSPITAL_COMMUNITY): Payer: Medicare Other

## 2016-05-20 VITALS — BP 156/90 | HR 72 | Ht 65.0 in | Wt 172.0 lb

## 2016-05-20 DIAGNOSIS — I110 Hypertensive heart disease with heart failure: Secondary | ICD-10-CM | POA: Diagnosis not present

## 2016-05-20 DIAGNOSIS — I495 Sick sinus syndrome: Secondary | ICD-10-CM | POA: Insufficient documentation

## 2016-05-20 DIAGNOSIS — R29898 Other symptoms and signs involving the musculoskeletal system: Secondary | ICD-10-CM | POA: Diagnosis not present

## 2016-05-20 DIAGNOSIS — I472 Ventricular tachycardia, unspecified: Secondary | ICD-10-CM

## 2016-05-20 DIAGNOSIS — Z95810 Presence of automatic (implantable) cardiac defibrillator: Secondary | ICD-10-CM | POA: Diagnosis not present

## 2016-05-20 DIAGNOSIS — Z01818 Encounter for other preprocedural examination: Secondary | ICD-10-CM

## 2016-05-20 DIAGNOSIS — I2510 Atherosclerotic heart disease of native coronary artery without angina pectoris: Secondary | ICD-10-CM | POA: Insufficient documentation

## 2016-05-20 DIAGNOSIS — I429 Cardiomyopathy, unspecified: Secondary | ICD-10-CM | POA: Diagnosis present

## 2016-05-20 DIAGNOSIS — I509 Heart failure, unspecified: Secondary | ICD-10-CM | POA: Diagnosis not present

## 2016-05-20 DIAGNOSIS — E785 Hyperlipidemia, unspecified: Secondary | ICD-10-CM | POA: Insufficient documentation

## 2016-05-20 NOTE — Patient Instructions (Addendum)
Medication Instructions:  Your physician recommends that you continue on your current medications as directed. Please refer to the Current Medication list given to you today.   Labwork: None ordered   Testing/Procedures: None ordered   Follow-Up: Your physician wants you to follow-up in: 12 months with Dr Court Joyaylor You will receive a reminder letter in the mail two months in advance. If you don't receive a letter, please call our office to schedule the follow-up appointment.  Okay to proceed with surgery, low risk from a cardiovascular standpoint   Any Other Special Instructions Will Be Listed Below (If Applicable).     If you need a refill on your cardiac medications before your next appointment, please call your pharmacy.
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
## 2016-05-23 LAB — CUP PACEART INCLINIC DEVICE CHECK
Date Time Interrogation Session: 20170829202656
HighPow Impedance: 74.0778
Lead Channel Pacing Threshold Amplitude: 0.75 V
Lead Channel Sensing Intrinsic Amplitude: 8.4 mV
MDC IDC LEAD IMPLANT DT: 20070501
MDC IDC LEAD LOCATION: 753860
MDC IDC MSMT BATTERY REMAINING LONGEVITY: 80.4
MDC IDC MSMT LEADCHNL RV IMPEDANCE VALUE: 512.5 Ohm
MDC IDC MSMT LEADCHNL RV PACING THRESHOLD PULSEWIDTH: 0.7 ms
MDC IDC PG SERIAL: 7138508
MDC IDC SET LEADCHNL RV PACING AMPLITUDE: 2.5 V
MDC IDC SET LEADCHNL RV PACING PULSEWIDTH: 0.7 ms
MDC IDC SET LEADCHNL RV SENSING SENSITIVITY: 0.5 mV
MDC IDC STAT BRADY RV PERCENT PACED: 0 %

## 2016-06-02 ENCOUNTER — Telehealth: Payer: Self-pay | Admitting: Internal Medicine

## 2016-06-02 NOTE — Telephone Encounter (Signed)
[name] is calling to find out if he has been cleared for Surgery , Please call .Marland Kitchen. Thanks

## 2016-06-02 NOTE — Telephone Encounter (Signed)
Spoke with patient and let him know he is cleared for surgery and that I will be glad to fax if he lets me know who to send to.  He is going to speak to his son and let me know if I need to send anything.

## 2016-06-18 ENCOUNTER — Encounter (HOSPITAL_COMMUNITY): Payer: Self-pay | Admitting: *Deleted

## 2016-06-18 NOTE — Progress Notes (Signed)
Pt denies any acute cardiopulmonary issues. Pt stated that his fasting blood glucose usually ranges between 100-110. Pt made aware of diabetes protocol to check blood glucose every 2 hours prior to arrival. Pt made aware of interventions for blood glucose <70. Pt made aware to not take any diabetes pills the morning of procedure ( Metformin, Invokana, Glimepiride) Pt made aware to stop taking vitamins, fish oil and herbal medications. Do not take any NSAIDs ie: Ibuprofen, Advil, Naproxen, BC and Goody Powder.  Pt verbalized understanding of all pre-op instructions. Marylene LandAngela, NP, Anesthesia, reviewed pt history; no new orders.

## 2016-06-18 NOTE — Anesthesia Preprocedure Evaluation (Addendum)
Anesthesia Evaluation  Patient identified by MRN, date of birth, ID band Patient awake    Reviewed: Allergy & Precautions, NPO status , Patient's Chart, lab work & pertinent test results, reviewed documented beta blocker date and time   History of Anesthesia Complications Negative for: history of anesthetic complications  Airway Mallampati: III  TM Distance: >3 FB Neck ROM: Full    Dental  (+) Dental Advisory Given,  Cap on top right tooth:   Pulmonary neg shortness of breath, neg sleep apnea, neg COPD, neg recent URI, former smoker,    Pulmonary exam normal breath sounds clear to auscultation       Cardiovascular hypertension, Pt. on medications and Pt. on home beta blockers (-) angina+ CAD, +CHF (EF 30-35%, grade I diastolic dysfunction) and + Orthopnea (2 pillows)  (-) Past MI and (-) Cardiac Stents + dysrhythmias (sick sinus syndrome) + Cardiac Defibrillator (St. Jude; last interrogated 05/20/2016 (no evidence of ventricular arrhythmias)) II Rhythm:Regular Rate:Normal  HLD  EKG 05/20/2016: NSR, anteroseptal infarct (age undetermined)  TTE 05/20/2016: Study Conclusions  - Left ventricle: The cavity size was normal. Wall thickness was   normal. Systolic function was moderately to severely reduced. The   estimated ejection fraction was in the range of 30% to 35%.   Diffuse hypokinesis. There is akinesis of the   mid-apicalanteroseptal and apical myocardium. Doppler parameters   are consistent with abnormal left ventricular relaxation (grade 1   diastolic dysfunction).  Impressions:  - Diffuse hypokinesis with akinesis of the distal anteroseptal wall   and apex; overall moderate to severe reduction in LV function;   grade 1 diastolic dysfunction.  02/07/14; ETT Conclusions Poor exercise tolerance for age Prolonged HR and BP recovery. No chest pain. THR was achieved. Duke Treadmill Score: +5 No exercise induced ischemic  EKG changes noted at given workload   Neuro/Psych negative neurological ROS     GI/Hepatic negative GI ROS, Neg liver ROS,   Endo/Other  diabetes, Type 2, Oral Hypoglycemic Agents  Renal/GU negative Renal ROS     Musculoskeletal   Abdominal   Peds  Hematology negative hematology ROS (+)   Anesthesia Other Findings   Reproductive/Obstetrics                             Anesthesia Physical  Anesthesia Plan  ASA: III  Anesthesia Plan: General   Post-op Pain Management:    Induction: Intravenous  Airway Management Planned: Oral ETT  Additional Equipment:   Intra-op Plan:   Post-operative Plan: Extubation in OR  Informed Consent: I have reviewed the patients History and Physical, chart, labs and discussed the procedure including the risks, benefits and alternatives for the proposed anesthesia with the patient or authorized representative who has indicated his/her understanding and acceptance.   Dental advisory given  Plan Discussed with: CRNA  Anesthesia Plan Comments: (Risks of general anesthesia discussed including, but not limited to, sore throat, hoarse voice, chipped/damaged teeth, injury to vocal cords, nausea and vomiting, allergic reactions, lung infection, heart attack, stroke, and death. All questions answered.  Discussed the benefits of performing nerve blocks for post-operative pain. The patient stated that he didn't have blocks for his previous surgery and does not want them for this surgery. )        Anesthesia Quick Evaluation  

## 2016-06-19 ENCOUNTER — Inpatient Hospital Stay (HOSPITAL_COMMUNITY): Payer: Medicare Other | Admitting: Anesthesiology

## 2016-06-19 ENCOUNTER — Inpatient Hospital Stay (HOSPITAL_COMMUNITY): Payer: Medicare Other

## 2016-06-19 ENCOUNTER — Encounter (HOSPITAL_COMMUNITY): Payer: Self-pay | Admitting: Surgery

## 2016-06-19 ENCOUNTER — Encounter (HOSPITAL_COMMUNITY)
Admission: RE | Disposition: A | Discharge status: Home / Self Care | Payer: Self-pay | Source: Ambulatory Visit | Attending: Orthopedic Surgery

## 2016-06-19 ENCOUNTER — Inpatient Hospital Stay (HOSPITAL_COMMUNITY)
Admission: RE | Admit: 2016-06-19 | Discharge: 2016-06-23 | DRG: 493 | Disposition: A | Discharge status: Home / Self Care | Payer: Medicare Other | Source: Ambulatory Visit | Attending: Orthopedic Surgery | Admitting: Orthopedic Surgery

## 2016-06-19 DIAGNOSIS — Z95810 Presence of automatic (implantable) cardiac defibrillator: Secondary | ICD-10-CM | POA: Diagnosis not present

## 2016-06-19 DIAGNOSIS — R339 Retention of urine, unspecified: Secondary | ICD-10-CM | POA: Diagnosis not present

## 2016-06-19 DIAGNOSIS — Z87891 Personal history of nicotine dependence: Secondary | ICD-10-CM | POA: Diagnosis not present

## 2016-06-19 DIAGNOSIS — E782 Mixed hyperlipidemia: Secondary | ICD-10-CM | POA: Diagnosis present

## 2016-06-19 DIAGNOSIS — I255 Ischemic cardiomyopathy: Secondary | ICD-10-CM | POA: Diagnosis present

## 2016-06-19 DIAGNOSIS — T84117A Breakdown (mechanical) of internal fixation device of bone of left lower leg, initial encounter: Principal | ICD-10-CM | POA: Diagnosis present

## 2016-06-19 DIAGNOSIS — E559 Vitamin D deficiency, unspecified: Secondary | ICD-10-CM | POA: Diagnosis present

## 2016-06-19 DIAGNOSIS — G8929 Other chronic pain: Secondary | ICD-10-CM | POA: Diagnosis present

## 2016-06-19 DIAGNOSIS — R310 Gross hematuria: Secondary | ICD-10-CM | POA: Diagnosis not present

## 2016-06-19 DIAGNOSIS — Z7984 Long term (current) use of oral hypoglycemic drugs: Secondary | ICD-10-CM | POA: Diagnosis not present

## 2016-06-19 DIAGNOSIS — M86662 Other chronic osteomyelitis, left tibia and fibula: Secondary | ICD-10-CM | POA: Diagnosis present

## 2016-06-19 DIAGNOSIS — I5022 Chronic systolic (congestive) heart failure: Secondary | ICD-10-CM | POA: Diagnosis present

## 2016-06-19 DIAGNOSIS — S82202N Unspecified fracture of shaft of left tibia, subsequent encounter for open fracture type IIIA, IIIB, or IIIC with nonunion: Secondary | ICD-10-CM | POA: Diagnosis present

## 2016-06-19 DIAGNOSIS — I2510 Atherosclerotic heart disease of native coronary artery without angina pectoris: Secondary | ICD-10-CM | POA: Diagnosis present

## 2016-06-19 DIAGNOSIS — I495 Sick sinus syndrome: Secondary | ICD-10-CM | POA: Diagnosis present

## 2016-06-19 DIAGNOSIS — I110 Hypertensive heart disease with heart failure: Secondary | ICD-10-CM | POA: Diagnosis present

## 2016-06-19 DIAGNOSIS — M869 Osteomyelitis, unspecified: Secondary | ICD-10-CM | POA: Diagnosis present

## 2016-06-19 DIAGNOSIS — S82401M Unspecified fracture of shaft of right fibula, subsequent encounter for open fracture type I or II with nonunion: Secondary | ICD-10-CM

## 2016-06-19 DIAGNOSIS — Y798 Miscellaneous orthopedic devices associated with adverse incidents, not elsewhere classified: Secondary | ICD-10-CM | POA: Diagnosis present

## 2016-06-19 DIAGNOSIS — Z01818 Encounter for other preprocedural examination: Secondary | ICD-10-CM

## 2016-06-19 DIAGNOSIS — S80812A Abrasion, left lower leg, initial encounter: Secondary | ICD-10-CM

## 2016-06-19 DIAGNOSIS — N99111 Postprocedural bulbous urethral stricture: Secondary | ICD-10-CM | POA: Diagnosis not present

## 2016-06-19 DIAGNOSIS — I10 Essential (primary) hypertension: Secondary | ICD-10-CM | POA: Diagnosis present

## 2016-06-19 DIAGNOSIS — I509 Heart failure, unspecified: Secondary | ICD-10-CM

## 2016-06-19 DIAGNOSIS — E1169 Type 2 diabetes mellitus with other specified complication: Secondary | ICD-10-CM | POA: Diagnosis present

## 2016-06-19 DIAGNOSIS — L089 Local infection of the skin and subcutaneous tissue, unspecified: Secondary | ICD-10-CM

## 2016-06-19 DIAGNOSIS — S82201M Unspecified fracture of shaft of right tibia, subsequent encounter for open fracture type I or II with nonunion: Secondary | ICD-10-CM | POA: Diagnosis present

## 2016-06-19 HISTORY — DX: Unspecified fracture of shaft of right tibia, subsequent encounter for open fracture type I or II with nonunion: S82.201M

## 2016-06-19 HISTORY — DX: Type 2 diabetes mellitus without complications: E11.9

## 2016-06-19 HISTORY — DX: Ischemic cardiomyopathy: I25.5

## 2016-06-19 HISTORY — DX: Osteomyelitis, unspecified: M86.9

## 2016-06-19 HISTORY — DX: Essential (primary) hypertension: I10

## 2016-06-19 HISTORY — DX: Unspecified fracture of shaft of right fibula, subsequent encounter for open fracture type I or II with nonunion: S82.401M

## 2016-06-19 HISTORY — PX: HARDWARE REMOVAL: SHX979

## 2016-06-19 HISTORY — PX: INTRAMEDULLARY (IM) CEMENTED ANTIOBIOTIC NAIL: SHX5873

## 2016-06-19 LAB — CBC WITH DIFFERENTIAL/PLATELET
BASOS ABS: 0 10*3/uL (ref 0.0–0.1)
BASOS PCT: 0 %
EOS ABS: 0.3 10*3/uL (ref 0.0–0.7)
EOS PCT: 4 %
HCT: 47.9 % (ref 39.0–52.0)
Hemoglobin: 14.9 g/dL (ref 13.0–17.0)
Lymphocytes Relative: 38 %
Lymphs Abs: 2.6 10*3/uL (ref 0.7–4.0)
MCH: 26.7 pg (ref 26.0–34.0)
MCHC: 31.1 g/dL (ref 30.0–36.0)
MCV: 85.7 fL (ref 78.0–100.0)
MONO ABS: 0.5 10*3/uL (ref 0.1–1.0)
Monocytes Relative: 8 %
NEUTROS ABS: 3.4 10*3/uL (ref 1.7–7.7)
Neutrophils Relative %: 50 %
PLATELETS: 214 10*3/uL (ref 150–400)
RBC: 5.59 MIL/uL (ref 4.22–5.81)
RDW: 14.4 % (ref 11.5–15.5)
WBC: 6.9 10*3/uL (ref 4.0–10.5)

## 2016-06-19 LAB — TYPE AND SCREEN
ABO/RH(D): A POS
ANTIBODY SCREEN: NEGATIVE

## 2016-06-19 LAB — COMPREHENSIVE METABOLIC PANEL
ALBUMIN: 4 g/dL (ref 3.5–5.0)
ALT: 18 U/L (ref 17–63)
ANION GAP: 7 (ref 5–15)
AST: 24 U/L (ref 15–41)
Alkaline Phosphatase: 61 U/L (ref 38–126)
BUN: 15 mg/dL (ref 6–20)
CHLORIDE: 106 mmol/L (ref 101–111)
CO2: 26 mmol/L (ref 22–32)
Calcium: 9.5 mg/dL (ref 8.9–10.3)
Creatinine, Ser: 1.1 mg/dL (ref 0.61–1.24)
GFR calc Af Amer: >60 mL/min (ref >60)
GFR calc non Af Amer: >60 mL/min (ref >60)
GLUCOSE: 119 mg/dL — AB (ref 65–99)
POTASSIUM: 4.7 mmol/L (ref 3.5–5.1)
SODIUM: 139 mmol/L (ref 135–145)
Total Bilirubin: 0.9 mg/dL (ref 0.3–1.2)
Total Protein: 7 g/dL (ref 6.5–8.1)

## 2016-06-19 LAB — APTT: APTT: 33 s (ref 24–36)

## 2016-06-19 LAB — PROTIME-INR
INR: 1.02
Prothrombin Time: 13.4 seconds (ref 11.4–15.2)

## 2016-06-19 LAB — TSH: TSH: 3.756 u[IU]/mL (ref 0.350–4.500)

## 2016-06-19 LAB — PREALBUMIN: Prealbumin: 27.9 mg/dL (ref 18–38)

## 2016-06-19 LAB — GLUCOSE, CAPILLARY
GLUCOSE-CAPILLARY: 114 mg/dL — AB (ref 65–99)
GLUCOSE-CAPILLARY: 106 mg/dL — AB (ref 65–99)
Glucose-Capillary: 140 mg/dL — ABNORMAL HIGH (ref 65–99)

## 2016-06-19 LAB — TRANSFERRIN: TRANSFERRIN: 362 mg/dL — AB (ref 180–329)

## 2016-06-19 LAB — C-REACTIVE PROTEIN: CRP: 0.5 mg/dL (ref <1.0)

## 2016-06-19 LAB — MAGNESIUM: MAGNESIUM: 1.7 mg/dL (ref 1.7–2.4)

## 2016-06-19 LAB — SEDIMENTATION RATE: Sed Rate: 4 mm/hr (ref 0–16)

## 2016-06-19 LAB — ABO/RH: ABO/RH(D): A POS

## 2016-06-19 LAB — PHOSPHORUS: PHOSPHORUS: 2.8 mg/dL (ref 2.5–4.6)

## 2016-06-19 SURGERY — INSERTION, ANTIBIOTIC IMPREGNATED INTRAMEDULLARY NAIL
Anesthesia: General | Site: Leg Lower | Laterality: Left

## 2016-06-19 MED ORDER — POTASSIUM CHLORIDE IN NACL 20-0.9 MEQ/L-% IV SOLN
INTRAVENOUS | Status: DC
  Administered 2016-06-19: via INTRAVENOUS
  Filled 2016-06-19 (×2): qty 1000

## 2016-06-19 MED ORDER — VANCOMYCIN HCL 1000 MG IV SOLR
INTRAVENOUS | Status: DC | PRN
  Administered 2016-06-19: 1000 mg

## 2016-06-19 MED ORDER — TOBRAMYCIN SULFATE 80 MG/2ML IJ SOLN
INTRAMUSCULAR | Status: AC
  Filled 2016-06-19: qty 2

## 2016-06-19 MED ORDER — CHLORHEXIDINE GLUCONATE 4 % EX LIQD: 60.0000 mL | Freq: Once | CUTANEOUS | Status: DC

## 2016-06-19 MED ORDER — ACETAMINOPHEN 500 MG PO TABS
1000.0000 mg | ORAL_TABLET | Freq: Four times a day (QID) | ORAL | Status: DC
  Administered 2016-06-19 – 2016-06-23 (×13): 1000 mg via ORAL
  Filled 2016-06-19 (×14): qty 2

## 2016-06-19 MED ORDER — MIDAZOLAM HCL 5 MG/5ML IJ SOLN
INTRAMUSCULAR | Status: DC | PRN
  Administered 2016-06-19: 2 mg via INTRAVENOUS

## 2016-06-19 MED ORDER — AMIODARONE HCL 100 MG PO TABS
200.0000 mg | ORAL_TABLET | Freq: Every day | ORAL | Status: DC
  Administered 2016-06-20 – 2016-06-23 (×4): 200 mg via ORAL
  Filled 2016-06-19 (×4): qty 2

## 2016-06-19 MED ORDER — CALCIUM CHLORIDE 10 % IV SOLN
INTRAVENOUS | Status: DC | PRN
  Administered 2016-06-19: 0.5 g via INTRAVENOUS

## 2016-06-19 MED ORDER — TOBRAMYCIN SULFATE 1.2 G IJ SOLR
INTRAMUSCULAR | Status: AC
  Filled 2016-06-19: qty 2.4

## 2016-06-19 MED ORDER — ONDANSETRON HCL 4 MG/2ML IJ SOLN: 4.0000 mg | Freq: Four times a day (QID) | INTRAMUSCULAR | Status: DC | PRN

## 2016-06-19 MED ORDER — MIDAZOLAM HCL 2 MG/2ML IJ SOLN
INTRAMUSCULAR | Status: AC
  Filled 2016-06-19: qty 2

## 2016-06-19 MED ORDER — ALBUMIN HUMAN 5 % IV SOLN
INTRAVENOUS | Status: DC | PRN
  Administered 2016-06-19: 19:00:00 via INTRAVENOUS

## 2016-06-19 MED ORDER — VANCOMYCIN HCL 10 G IV SOLR
1500.0000 mg | Freq: Once | INTRAVENOUS | Status: AC
  Administered 2016-06-19: 1500 mg via INTRAVENOUS
  Filled 2016-06-19: qty 1500

## 2016-06-19 MED ORDER — METOCLOPRAMIDE HCL 5 MG/ML IJ SOLN: 5.0000 mg | Freq: Three times a day (TID) | INTRAMUSCULAR | Status: DC | PRN

## 2016-06-19 MED ORDER — MAGNESIUM HYDROXIDE 400 MG/5ML PO SUSP: 30.0000 mL | Freq: Every day | ORAL | Status: DC | PRN

## 2016-06-19 MED ORDER — ROCURONIUM BROMIDE 10 MG/ML (PF) SYRINGE
PREFILLED_SYRINGE | INTRAVENOUS | Status: DC | PRN
  Administered 2016-06-19: 40 mg via INTRAVENOUS
  Administered 2016-06-19 (×2): 30 mg via INTRAVENOUS

## 2016-06-19 MED ORDER — ONDANSETRON HCL 4 MG/2ML IJ SOLN: 4.0000 mg | Freq: Once | INTRAMUSCULAR | Status: DC | PRN

## 2016-06-19 MED ORDER — HYDROMORPHONE HCL 1 MG/ML IJ SOLN
INTRAMUSCULAR | Status: AC
  Filled 2016-06-19: qty 1

## 2016-06-19 MED ORDER — ONDANSETRON HCL 4 MG PO TABS: 4.0000 mg | ORAL_TABLET | Freq: Four times a day (QID) | ORAL | Status: DC | PRN

## 2016-06-19 MED ORDER — ACETAMINOPHEN 325 MG PO TABS: 650.0000 mg | ORAL_TABLET | Freq: Four times a day (QID) | ORAL | Status: DC | PRN

## 2016-06-19 MED ORDER — BISACODYL 5 MG PO TBEC: 5.0000 mg | DELAYED_RELEASE_TABLET | Freq: Every day | ORAL | Status: DC | PRN

## 2016-06-19 MED ORDER — POTASSIUM CHLORIDE IN NACL 20-0.9 MEQ/L-% IV SOLN: INTRAVENOUS | Status: DC

## 2016-06-19 MED ORDER — KETAMINE HCL 10 MG/ML IJ SOLN
INTRAMUSCULAR | Status: DC | PRN
  Administered 2016-06-19: 20 mg via INTRAVENOUS
  Administered 2016-06-19: 30 mg via INTRAVENOUS
  Administered 2016-06-19: 50 mg via INTRAVENOUS

## 2016-06-19 MED ORDER — TOBRAMYCIN SULFATE 1.2 G IJ SOLR
INTRAMUSCULAR | Status: DC | PRN
  Administered 2016-06-19: 1.2 g

## 2016-06-19 MED ORDER — FENOFIBRATE 160 MG PO TABS
160.0000 mg | ORAL_TABLET | Freq: Every day | ORAL | Status: DC
  Administered 2016-06-20 – 2016-06-23 (×4): 160 mg via ORAL
  Filled 2016-06-19 (×4): qty 1

## 2016-06-19 MED ORDER — LACTATED RINGERS IV SOLN
INTRAVENOUS | Status: DC
  Administered 2016-06-19 (×2): via INTRAVENOUS

## 2016-06-19 MED ORDER — METOCLOPRAMIDE HCL 5 MG PO TABS: 5.0000 mg | ORAL_TABLET | Freq: Three times a day (TID) | ORAL | Status: DC | PRN

## 2016-06-19 MED ORDER — 0.9 % SODIUM CHLORIDE (POUR BTL) OPTIME
TOPICAL | Status: DC | PRN
  Administered 2016-06-19: 1000 mL

## 2016-06-19 MED ORDER — ATORVASTATIN CALCIUM 40 MG PO TABS
40.0000 mg | ORAL_TABLET | Freq: Every day | ORAL | Status: DC
  Administered 2016-06-20 – 2016-06-23 (×4): 40 mg via ORAL
  Filled 2016-06-19 (×4): qty 1

## 2016-06-19 MED ORDER — VANCOMYCIN HCL 1000 MG IV SOLR
INTRAVENOUS | Status: AC
  Filled 2016-06-19: qty 1000

## 2016-06-19 MED ORDER — NEOSTIGMINE METHYLSULFATE 10 MG/10ML IV SOLN
INTRAVENOUS | Status: DC | PRN
  Administered 2016-06-19: 3 mg via INTRAVENOUS

## 2016-06-19 MED ORDER — NEBIVOLOL HCL 5 MG PO TABS
10.0000 mg | ORAL_TABLET | Freq: Every day | ORAL | Status: DC
  Administered 2016-06-20 – 2016-06-23 (×4): 10 mg via ORAL
  Filled 2016-06-19 (×4): qty 2

## 2016-06-19 MED ORDER — METHOCARBAMOL 500 MG PO TABS
500.0000 mg | ORAL_TABLET | Freq: Four times a day (QID) | ORAL | Status: DC | PRN
Start: 2016-06-19 — End: 2016-06-23
  Administered 2016-06-21: 500 mg via ORAL
  Filled 2016-06-19: qty 1

## 2016-06-19 MED ORDER — FENTANYL CITRATE (PF) 100 MCG/2ML IJ SOLN
INTRAMUSCULAR | Status: AC
  Filled 2016-06-19: qty 2

## 2016-06-19 MED ORDER — PHENYLEPHRINE HCL 10 MG/ML IJ SOLN
INTRAVENOUS | Status: DC | PRN
  Administered 2016-06-19: 30 ug/min via INTRAVENOUS

## 2016-06-19 MED ORDER — VANCOMYCIN HCL IN DEXTROSE 750-5 MG/150ML-% IV SOLN
750.0000 mg | Freq: Two times a day (BID) | INTRAVENOUS | Status: DC
  Administered 2016-06-20 – 2016-06-23 (×7): 750 mg via INTRAVENOUS
  Filled 2016-06-19 (×8): qty 150

## 2016-06-19 MED ORDER — LIDOCAINE 2% (20 MG/ML) 5 ML SYRINGE
INTRAMUSCULAR | Status: DC | PRN
  Administered 2016-06-19: 60 mg via INTRAVENOUS

## 2016-06-19 MED ORDER — HYDROMORPHONE HCL 1 MG/ML IJ SOLN
INTRAMUSCULAR | Status: DC | PRN
  Administered 2016-06-19: 1 mg via INTRAVENOUS

## 2016-06-19 MED ORDER — ONDANSETRON HCL 4 MG/2ML IJ SOLN
INTRAMUSCULAR | Status: DC | PRN
  Administered 2016-06-19 (×2): 4 mg via INTRAVENOUS

## 2016-06-19 MED ORDER — OXYCODONE HCL 5 MG PO TABS
10.0000 mg | ORAL_TABLET | Freq: Four times a day (QID) | ORAL | Status: DC | PRN
  Administered 2016-06-21 – 2016-06-22 (×3): 20 mg via ORAL
  Administered 2016-06-22 (×2): 10 mg via ORAL
  Administered 2016-06-23: 20 mg via ORAL
  Filled 2016-06-19: qty 4
  Filled 2016-06-19: qty 2
  Filled 2016-06-19 (×2): qty 4
  Filled 2016-06-19: qty 2
  Filled 2016-06-19: qty 4

## 2016-06-19 MED ORDER — FENTANYL CITRATE (PF) 100 MCG/2ML IJ SOLN: 25.0000 ug | INTRAMUSCULAR | Status: DC | PRN

## 2016-06-19 MED ORDER — PIPERACILLIN-TAZOBACTAM 3.375 G IVPB
3.3750 g | Freq: Once | INTRAVENOUS | Status: AC
  Administered 2016-06-19: 3.375 g via INTRAVENOUS
  Filled 2016-06-19: qty 50

## 2016-06-19 MED ORDER — TOBRAMYCIN SULFATE 80 MG/2ML IJ SOLN
INTRAMUSCULAR | Status: AC
  Filled 2016-06-19: qty 4

## 2016-06-19 MED ORDER — ENOXAPARIN SODIUM 40 MG/0.4ML ~~LOC~~ SOLN
40.0000 mg | SUBCUTANEOUS | Status: DC
Start: 2016-06-20 — End: 2016-06-23
  Administered 2016-06-20 – 2016-06-23 (×4): 40 mg via SUBCUTANEOUS
  Filled 2016-06-19 (×4): qty 0.4

## 2016-06-19 MED ORDER — TOBRAMYCIN SULFATE 1.2 G IJ SOLR
INTRAMUSCULAR | Status: DC | PRN
Start: 2016-06-19 — End: 2016-06-19
  Administered 2016-06-19: 1.2 g

## 2016-06-19 MED ORDER — ACETAMINOPHEN 500 MG PO TABS
1000.0000 mg | ORAL_TABLET | Freq: Once | ORAL | Status: AC
  Administered 2016-06-19: 1000 mg via ORAL
  Filled 2016-06-19: qty 2

## 2016-06-19 MED ORDER — KETAMINE HCL-SODIUM CHLORIDE 100-0.9 MG/10ML-% IV SOSY
PREFILLED_SYRINGE | INTRAVENOUS | Status: AC
  Filled 2016-06-19: qty 10

## 2016-06-19 MED ORDER — PHENYLEPHRINE HCL 10 MG/ML IJ SOLN
INTRAMUSCULAR | Status: DC | PRN
  Administered 2016-06-19: 160 ug via INTRAVENOUS
  Administered 2016-06-19: 80 ug via INTRAVENOUS

## 2016-06-19 MED ORDER — FENTANYL CITRATE (PF) 100 MCG/2ML IJ SOLN
INTRAMUSCULAR | Status: DC | PRN
  Administered 2016-06-19: 100 ug via INTRAVENOUS
  Administered 2016-06-19 (×4): 50 ug via INTRAVENOUS

## 2016-06-19 MED ORDER — METHOCARBAMOL 1000 MG/10ML IJ SOLN
1000.0000 mg | Freq: Four times a day (QID) | INTRAMUSCULAR | Status: DC | PRN
  Filled 2016-06-19: qty 10

## 2016-06-19 MED ORDER — TOBRAMYCIN SULFATE 1.2 G IJ SOLR
INTRAMUSCULAR | Status: AC
  Filled 2016-06-19: qty 1.2

## 2016-06-19 MED ORDER — HYDROMORPHONE HCL 1 MG/ML IJ SOLN
0.5000 mg | INTRAMUSCULAR | Status: DC | PRN
  Administered 2016-06-19: 1 mg via INTRAVENOUS
  Filled 2016-06-19: qty 1

## 2016-06-19 MED ORDER — DOCUSATE SODIUM 100 MG PO CAPS
100.0000 mg | ORAL_CAPSULE | Freq: Two times a day (BID) | ORAL | Status: DC
  Administered 2016-06-20 – 2016-06-23 (×7): 100 mg via ORAL
  Filled 2016-06-19 (×8): qty 1

## 2016-06-19 MED ORDER — VANCOMYCIN HCL 1000 MG IV SOLR
INTRAVENOUS | Status: AC
  Filled 2016-06-19: qty 2000

## 2016-06-19 MED ORDER — ACETAMINOPHEN 650 MG RE SUPP: 650.0000 mg | Freq: Four times a day (QID) | RECTAL | Status: DC | PRN

## 2016-06-19 MED ORDER — PROPOFOL 10 MG/ML IV BOLUS
INTRAVENOUS | Status: DC | PRN
  Administered 2016-06-19: 50 mg via INTRAVENOUS
  Administered 2016-06-19: 130 mg via INTRAVENOUS

## 2016-06-19 MED ORDER — GLYCOPYRROLATE 0.2 MG/ML IJ SOLN
INTRAMUSCULAR | Status: DC | PRN
  Administered 2016-06-19: 0.4 mg via INTRAVENOUS

## 2016-06-19 MED ORDER — SODIUM CHLORIDE 0.9 % IR SOLN
Status: DC | PRN
  Administered 2016-06-19: 1000 mL
  Administered 2016-06-19: 3000 mL

## 2016-06-19 MED ORDER — PIPERACILLIN-TAZOBACTAM 3.375 G IVPB
3.3750 g | Freq: Three times a day (TID) | INTRAVENOUS | Status: DC
  Administered 2016-06-20 – 2016-06-23 (×11): 3.375 g via INTRAVENOUS
  Filled 2016-06-19 (×13): qty 50

## 2016-06-19 MED ORDER — FENTANYL CITRATE (PF) 100 MCG/2ML IJ SOLN
INTRAMUSCULAR | Status: AC
  Filled 2016-06-19: qty 4

## 2016-06-19 SURGICAL SUPPLY — 95 items
BANDAGE ACE 4X5 VEL STRL LF (GAUZE/BANDAGES/DRESSINGS) ×3 IMPLANT
BANDAGE ACE 6X5 VEL STRL LF (GAUZE/BANDAGES/DRESSINGS) ×3 IMPLANT
BANDAGE ELASTIC 4 VELCRO ST LF (GAUZE/BANDAGES/DRESSINGS) ×3 IMPLANT
BANDAGE ELASTIC 6 VELCRO ST LF (GAUZE/BANDAGES/DRESSINGS) ×3 IMPLANT
BANDAGE ESMARK 6X9 LF (GAUZE/BANDAGES/DRESSINGS) ×1 IMPLANT
BLADE SURG 10 STRL SS (BLADE) ×6 IMPLANT
BNDG COHESIVE 4X5 TAN STRL (GAUZE/BANDAGES/DRESSINGS) ×3 IMPLANT
BNDG COHESIVE 6X5 TAN STRL LF (GAUZE/BANDAGES/DRESSINGS) ×3 IMPLANT
BNDG ESMARK 6X9 LF (GAUZE/BANDAGES/DRESSINGS) ×3
BNDG GAUZE ELAST 4 BULKY (GAUZE/BANDAGES/DRESSINGS) ×3 IMPLANT
BONE CEMENT PALACOSE (Orthopedic Implant) IMPLANT
BRUSH SCRUB DISP (MISCELLANEOUS) ×6 IMPLANT
CATH COUDE FOLEY 5CC 14FR (CATHETERS) ×3 IMPLANT
CATH FOLEY 16FR TEMP PROBE (CATHETERS) ×3 IMPLANT
CATH THORACIC 36FR (CATHETERS) ×3 IMPLANT
CATH THORACIC 40FR (CATHETERS) ×9 IMPLANT
CATH TROCAR 32FR (CATHETERS) ×3 IMPLANT
CEMENT BONE PALACOSE (Orthopedic Implant) IMPLANT
CEMENT BONE SIMPLEX SPEEDSET (Cement) ×3 IMPLANT
CLEANER TIP ELECTROSURG 2X2 (MISCELLANEOUS) ×3 IMPLANT
CLOSURE WOUND 1/2 X4 (GAUZE/BANDAGES/DRESSINGS) ×1
COVER SURGICAL LIGHT HANDLE (MISCELLANEOUS) ×6 IMPLANT
CUFF TOURNIQUET SINGLE 18IN (TOURNIQUET CUFF) IMPLANT
CUFF TOURNIQUET SINGLE 24IN (TOURNIQUET CUFF) IMPLANT
CUFF TOURNIQUET SINGLE 34IN LL (TOURNIQUET CUFF) IMPLANT
DRAPE C-ARM 42X72 X-RAY (DRAPES) ×3 IMPLANT
DRAPE C-ARMOR (DRAPES) ×6 IMPLANT
DRAPE INCISE IOBAN 66X45 STRL (DRAPES) ×3 IMPLANT
DRAPE OEC MINIVIEW 54X84 (DRAPES) ×3 IMPLANT
DRAPE ORTHO SPLIT 77X108 STRL (DRAPES) ×6
DRAPE SURG ORHT 6 SPLT 77X108 (DRAPES) ×2 IMPLANT
DRAPE U-SHAPE 47X51 STRL (DRAPES) ×3 IMPLANT
DRSG ADAPTIC 3X8 NADH LF (GAUZE/BANDAGES/DRESSINGS) ×3 IMPLANT
DRSG PAD ABDOMINAL 8X10 ST (GAUZE/BANDAGES/DRESSINGS) ×6 IMPLANT
ELECT CAUTERY BLADE 6.4 (BLADE) ×3 IMPLANT
ELECT REM PT RETURN 9FT ADLT (ELECTROSURGICAL) ×3
ELECTRODE REM PT RTRN 9FT ADLT (ELECTROSURGICAL) ×1 IMPLANT
EVACUATOR 1/8 PVC DRAIN (DRAIN) IMPLANT
GAUZE SPONGE 4X4 12PLY STRL (GAUZE/BANDAGES/DRESSINGS) ×3 IMPLANT
GLOVE BIO SURGEON STRL SZ7.5 (GLOVE) ×3 IMPLANT
GLOVE BIO SURGEON STRL SZ8 (GLOVE) ×3 IMPLANT
GLOVE BIO SURGEON STRL SZ8.5 (GLOVE) ×6 IMPLANT
GLOVE BIOGEL PI IND STRL 7.5 (GLOVE) ×2 IMPLANT
GLOVE BIOGEL PI IND STRL 8 (GLOVE) ×2 IMPLANT
GLOVE BIOGEL PI INDICATOR 7.5 (GLOVE) ×4
GLOVE BIOGEL PI INDICATOR 8 (GLOVE) ×4
GLOVE SURG SS PI 7.5 STRL IVOR (GLOVE) ×3 IMPLANT
GOWN STRL REUS W/ TWL LRG LVL3 (GOWN DISPOSABLE) ×2 IMPLANT
GOWN STRL REUS W/ TWL XL LVL3 (GOWN DISPOSABLE) ×1 IMPLANT
GOWN STRL REUS W/TWL LRG LVL3 (GOWN DISPOSABLE) ×6
GOWN STRL REUS W/TWL XL LVL3 (GOWN DISPOSABLE) ×3
GUIDEWIRE BALL NOSE 80CM (WIRE) ×3 IMPLANT
KIT BASIN OR (CUSTOM PROCEDURE TRAY) ×3 IMPLANT
KIT ROOM TURNOVER OR (KITS) ×3 IMPLANT
MANIFOLD NEPTUNE II (INSTRUMENTS) ×3 IMPLANT
NEEDLE 22X1 1/2 (OR ONLY) (NEEDLE) IMPLANT
NOZZLE PRISM 8.5MM (MISCELLANEOUS) ×6 IMPLANT
NS IRRIG 1000ML POUR BTL (IV SOLUTION) ×3 IMPLANT
PACK GENERAL/GYN (CUSTOM PROCEDURE TRAY) ×3 IMPLANT
PACK ORTHO EXTREMITY (CUSTOM PROCEDURE TRAY) ×3 IMPLANT
PAD ARMBOARD 7.5X6 YLW CONV (MISCELLANEOUS) ×6 IMPLANT
PADDING CAST COTTON 6X4 STRL (CAST SUPPLIES) ×9 IMPLANT
REAMER ROD DEEP FLUTE 2.5X950 (INSTRUMENTS) ×9 IMPLANT
SET CYSTO W/LG BORE CLAMP LF (SET/KITS/TRAYS/PACK) ×3 IMPLANT
SPONGE GAUZE 4X4 12PLY STER LF (GAUZE/BANDAGES/DRESSINGS) ×3 IMPLANT
SPONGE LAP 18X18 X RAY DECT (DISPOSABLE) ×12 IMPLANT
SPONGE SCRUB IODOPHOR (GAUZE/BANDAGES/DRESSINGS) ×3 IMPLANT
STAPLER VISISTAT 35W (STAPLE) ×6 IMPLANT
STOCKINETTE IMPERVIOUS LG (DRAPES) ×3 IMPLANT
STRIP CLOSURE SKIN 1/2X4 (GAUZE/BANDAGES/DRESSINGS) ×2 IMPLANT
SUCTION FRAZIER HANDLE 10FR (MISCELLANEOUS)
SUCTION TUBE FRAZIER 10FR DISP (MISCELLANEOUS) IMPLANT
SUT ETHILON 2 0 PSLX (SUTURE) ×6 IMPLANT
SUT ETHILON 3 0 PS 1 (SUTURE) IMPLANT
SUT ETHILON 4 0 PS 2 18 (SUTURE) ×9 IMPLANT
SUT PDS AB 0 CT 36 (SUTURE) ×6 IMPLANT
SUT PDS AB 1 CT  36 (SUTURE) ×2
SUT PDS AB 1 CT 36 (SUTURE) ×1 IMPLANT
SUT PDS AB 2-0 CT1 27 (SUTURE) ×6 IMPLANT
SUT PROLENE 3 0 PS 2 (SUTURE) IMPLANT
SUT VIC AB 0 CT1 27 (SUTURE)
SUT VIC AB 0 CT1 27XBRD ANBCTR (SUTURE) IMPLANT
SUT VIC AB 2-0 CT1 27 (SUTURE)
SUT VIC AB 2-0 CT1 TAPERPNT 27 (SUTURE) IMPLANT
SUT VIC AB 2-0 CT3 27 (SUTURE) IMPLANT
SYR CONTROL 10ML LL (SYRINGE) IMPLANT
TOWEL OR 17X24 6PK STRL BLUE (TOWEL DISPOSABLE) ×6 IMPLANT
TOWEL OR 17X26 10 PK STRL BLUE (TOWEL DISPOSABLE) ×6 IMPLANT
TOWER CARTRIDGE SMART MIX (DISPOSABLE) ×9 IMPLANT
TRAY FOLEY CATH 16FRSI W/METER (SET/KITS/TRAYS/PACK) ×3 IMPLANT
TUBE CONNECTING 12'X1/4 (SUCTIONS) ×1
TUBE CONNECTING 12X1/4 (SUCTIONS) ×2 IMPLANT
UNDERPAD 30X30 (UNDERPADS AND DIAPERS) ×3 IMPLANT
WATER STERILE IRR 1000ML POUR (IV SOLUTION) ×6 IMPLANT
YANKAUER SUCT BULB TIP NO VENT (SUCTIONS) ×6 IMPLANT

## 2016-06-19 NOTE — Anesthesia Procedure Notes (Signed)
Procedure Name: Intubation Date/Time: 06/19/2016 6:00 PM Performed by: Geraldo DockerSOLHEIM, Aayliah Rotenberry SALOMON Pre-anesthesia Checklist: Patient identified, Patient being monitored, Timeout performed, Emergency Drugs available and Suction available Patient Re-evaluated:Patient Re-evaluated prior to inductionOxygen Delivery Method: Circle System Utilized Preoxygenation: Pre-oxygenation with 100% oxygen Intubation Type: IV induction Ventilation: Mask ventilation without difficulty Laryngoscope Size: 3 and Mac Grade View: Grade III Tube type: Oral Tube size: 8.0 mm Number of attempts: 1 Airway Equipment and Method: Stylet Placement Confirmation: ETT inserted through vocal cords under direct vision,  positive ETCO2 and breath sounds checked- equal and bilateral Secured at: 22 cm Tube secured with: Tape Dental Injury: Teeth and Oropharynx as per pre-operative assessment
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
## 2016-06-19 NOTE — Progress Notes (Signed)
Pharmacy Antibiotic Note  [name] is a 70 y.o. male admitted on 06/19/2016 with osteomyelitis. Pharmacy has been consulted for Vancomycin/Zosyn dosing. WBC WNL. Renal function good. Pt with L tibial osteomyelitis with non-union now s/p OR for hardware removal and nailing. Vancomycin/Zosyn x 1 already given around 2000.   Plan: -Vancomycin 750 mg IV q12h -Zosyn 3.375G IV q8h to be infused over 4 hours -Trend WBC, temp, renal function  -Drug levels as indicated   Height: 5\' 5"  (165.1 cm) Weight: 170 lb (77.1 kg) IBW/kg (Calculated) : 61.5  Temp (24hrs), Avg:98 F (36.7 C), Min:97.7 F (36.5 C), Max:98.6 F (37 C)   Recent Labs Lab 06/19/16 1201  WBC 6.9  CREATININE 1.10    Estimated Creatinine Clearance: 59 mL/min (by C-G formula based on SCr of 1.1 mg/dL).    No Known Allergies  [name], [name] 06/19/2016 11:50 PM

## 2016-06-19 NOTE — Transfer of Care (Signed)
Immediate Anesthesia Transfer of Care Note  Patient: [name]  Procedure(s) Performed: Procedure(s): INTRAMEDULLARY (IM) NAIL TIBIAL ANTIBIOTIC NAIL (Left) HARDWARE REMOVAL LEFT TIBIAL (Left)  Patient Location: PACU  Anesthesia Type:General  Level of Consciousness: responds to stimulation  Airway & Oxygen Therapy: Patient Spontanous Breathing and Patient connected to nasal cannula oxygen  Post-op Assessment: Report given to RN and Post -op Vital signs reviewed and stable  Post vital signs: Reviewed and stable  Last Vitals:  Vitals:   06/19/16 1052 06/19/16 2216  BP: (!) 188/93   Pulse: 61   Resp: 18   Temp: 37 C (P) 36.5 C    Last Pain:  Vitals:   06/19/16 2216  TempSrc:   PainSc: (P) 0-No pain      Patients Stated Pain Goal: 3 (06/19/16 1135)  Complications: No apparent anesthesia complications

## 2016-06-19 NOTE — Brief Op Note (Signed)
06/19/2016  10:22 PM  PATIENT:  [name]  70 y.o. 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 BROKEN AND LOOSE HARDWARE LEFT TIBIA  PROCEDURE:  Procedure(s): 1. PARTIAL EXCISION TIBIA 2. INTRAMEDULLARY (IM) NAIL TIBIAL ANTIBIOTIC NAIL (Left) WITH TOBRAMYCIN AND VANCOMYCIN 3. HARDWARE REMOVAL LEFT TIBIAL (Left)  SURGEON:  Surgeon(s) and Role:    * Myrene GalasMichael Shylah Dossantos, MD - Primary  PHYSICIAN ASSISTANT: Montez MoritaKEITH PAUL, PAC  ANESTHESIA:   general  EBL:  Total I/O In: 1050 [I.V.:800; IV Piggyback:250] Out: 300 [Blood:300]  BLOOD ADMINISTERED:none  DRAINS: none   LOCAL MEDICATIONS USED:  NONE  SPECIMEN:  Source of Specimen:  REAMINGS  DISPOSITION OF SPECIMEN:  MICRO FOR CULTURES  COUNTS:  YES  TOURNIQUET:  * No tourniquets in log *  DICTATION: .Other Dictation: Dictation Number [id number]  PLAN OF CARE: Admit to inpatient   PATIENT DISPOSITION:  PACU - hemodynamically stable.   Delay start of Pharmacological VTE agent (>24hrs) due to surgical blood loss or risk of bleeding: no

## 2016-06-19 NOTE — Progress Notes (Signed)
Sandy from [facility] called and states that we don't need to go any thing further. Dr Isaias CowmanAllan informed.

## 2016-06-19 NOTE — OR Nursing (Signed)
Two attempted foley cath 6816fr. @ end of procedure per Dr.Handy. unsucessful due to meat resistance and blood on 5414fr. Cath when pulled out. @2200 

## 2016-06-20 ENCOUNTER — Encounter (HOSPITAL_COMMUNITY): Payer: Self-pay | Admitting: General Practice

## 2016-06-20 LAB — VITAMIN D 25 HYDROXY (VIT D DEFICIENCY, FRACTURES): Vit D, 25-Hydroxy: 21.9 ng/mL — ABNORMAL LOW (ref 30.0–100.0)

## 2016-06-20 LAB — BASIC METABOLIC PANEL
Anion gap: 6 (ref 5–15)
BUN: 11 mg/dL (ref 6–20)
CHLORIDE: 106 mmol/L (ref 101–111)
CO2: 25 mmol/L (ref 22–32)
CREATININE: 1.01 mg/dL (ref 0.61–1.24)
Calcium: 8.8 mg/dL — ABNORMAL LOW (ref 8.9–10.3)
GFR calc Af Amer: >60 mL/min (ref >60)
GFR calc non Af Amer: >60 mL/min (ref >60)
GLUCOSE: 146 mg/dL — AB (ref 65–99)
POTASSIUM: 4.5 mmol/L (ref 3.5–5.1)
SODIUM: 137 mmol/L (ref 135–145)

## 2016-06-20 LAB — CREATININE, SERUM
Creatinine, Ser: 0.95 mg/dL (ref 0.61–1.24)
GFR calc non Af Amer: >60 mL/min (ref >60)

## 2016-06-20 LAB — GLUCOSE, CAPILLARY
GLUCOSE-CAPILLARY: 165 mg/dL — AB (ref 65–99)
GLUCOSE-CAPILLARY: 160 mg/dL — AB (ref 65–99)
Glucose-Capillary: 178 mg/dL — ABNORMAL HIGH (ref 65–99)
Glucose-Capillary: 142 mg/dL — ABNORMAL HIGH (ref 65–99)

## 2016-06-20 LAB — CBC
HEMATOCRIT: 38.9 % — AB (ref 39.0–52.0)
Hemoglobin: 12.4 g/dL — ABNORMAL LOW (ref 13.0–17.0)
MCH: 27.3 pg (ref 26.0–34.0)
MCHC: 31.9 g/dL (ref 30.0–36.0)
MCV: 85.7 fL (ref 78.0–100.0)
PLATELETS: 189 10*3/uL (ref 150–400)
RBC: 4.54 MIL/uL (ref 4.22–5.81)
RDW: 14.3 % (ref 11.5–15.5)
WBC: 11.4 10*3/uL — ABNORMAL HIGH (ref 4.0–10.5)

## 2016-06-20 LAB — CALCIUM, IONIZED: CALCIUM, IONIZED, SERUM: 5.3 mg/dL (ref 4.5–5.6)

## 2016-06-20 LAB — PTH, INTACT AND CALCIUM
CALCIUM TOTAL (PTH): 9.1 mg/dL (ref 8.6–10.2)
PTH: 21 pg/mL (ref 15–65)

## 2016-06-20 LAB — HEMOGLOBIN A1C
HEMOGLOBIN A1C: 6.9 % — AB (ref 4.8–5.6)
MEAN PLASMA GLUCOSE: 151 mg/dL

## 2016-06-20 MED ORDER — INSULIN ASPART 100 UNIT/ML ~~LOC~~ SOLN
0.0000 [IU] | Freq: Three times a day (TID) | SUBCUTANEOUS | Status: DC
  Administered 2016-06-20 – 2016-06-21 (×4): 3 [IU] via SUBCUTANEOUS
  Administered 2016-06-21 – 2016-06-22 (×2): 2 [IU] via SUBCUTANEOUS
  Administered 2016-06-22: 5 [IU] via SUBCUTANEOUS
  Administered 2016-06-23: 2 [IU] via SUBCUTANEOUS
  Administered 2016-06-23: 5 [IU] via SUBCUTANEOUS

## 2016-06-20 NOTE — Progress Notes (Signed)
Orthopedic Tech Progress Note Patient Details:  Karna DupesGulzar Bellisario 15-Feb-1946 [id number] OHF w/ Trapeze. Patient ID: Karna DupesGulzar Dennin, male   DOB: 15-Feb-1946, 70 y.o.   MRN: [id number]   Clois Dupesvery S Hammond Obeirne 06/20/2016, 4:35 PM

## 2016-06-20 NOTE — Progress Notes (Signed)
Orthopaedic Trauma Service Progress Note  Subjective  Doing ok  Did require cystoscopy, urethral dilation and foley placement this am by Dr. Augustin Schooling diet Pain really no more than normal   Review of Systems  Respiratory: Negative for shortness of breath and wheezing.   Cardiovascular: Negative for chest pain and palpitations.  Gastrointestinal: Negative for abdominal pain, nausea and vomiting.     Objective   BP 115/65 (BP Location: Left Arm)   Pulse 77   Temp 98.7 F (37.1 C) (Oral)   Resp 18   Ht 5' 5"  (1.651 m)   Wt 77.1 kg (170 lb)   SpO2 100%   BMI 28.29 kg/m   Intake/Output      09/28 0701 - 09/29 0700 09/29 0701 - 09/30 0700   P.O. 240 280   I.V. (mL/kg) 1800 (23.3)    IV Piggyback 250    Total Intake(mL/kg) 2290 (29.7) 280 (3.6)   Urine (mL/kg/hr) 1000    Stool 0    Blood 300    Total Output 1300     Net +990 +280        Urine Occurrence 0 x    Stool Occurrence 0 x      Labs  CBG (last 3)   Recent Labs  06/19/16 2215 06/20/16 1103 06/20/16 1247  GLUCAP 140* 165* 160*      Results for ADONIAS, DEMORE (MRN [id number]) as of 06/20/2016 14:53  Ref. Range 06/19/2016 23:48 06/20/2016 06:15  Sodium Latest Ref Range: 135 - 145 mmol/L  137  Potassium Latest Ref Range: 3.5 - 5.1 mmol/L  4.5  Chloride Latest Ref Range: 101 - 111 mmol/L  106  CO2 Latest Ref Range: 22 - 32 mmol/L  25  BUN Latest Ref Range: 6 - 20 mg/dL  11  Creatinine Latest Ref Range: 0.61 - 1.24 mg/dL 0.95 1.01  Calcium Latest Ref Range: 8.9 - 10.3 mg/dL  8.8 (L)  EGFR (Non-African Amer.) Latest Ref Range: >60 mL/min >60 >60  EGFR (African American) Latest Ref Range: >60 mL/min >60 >60  Glucose Latest Ref Range: 65 - 99 mg/dL  146 (H)  Anion gap Latest Ref Range: 5 - 15   6  WBC Latest Ref Range: 4.0 - 10.5 K/uL 11.4 (H)   RBC Latest Ref Range: 4.22 - 5.81 MIL/uL 4.54   Hemoglobin Latest Ref Range: 13.0 - 17.0 g/dL 12.4 (L)   HCT Latest Ref Range: 39.0 - 52.0 % 38.9 (L)    MCV Latest Ref Range: 78.0 - 100.0 fL 85.7   MCH Latest Ref Range: 26.0 - 34.0 pg 27.3   MCHC Latest Ref Range: 30.0 - 36.0 g/dL 31.9   RDW Latest Ref Range: 11.5 - 15.5 % 14.3   Platelets Latest Ref Range: 150 - 400 K/uL 189   Results for TRACI, PLEMONS (MRN [id number]) as of 06/20/2016 14:53  Ref. Range 06/19/2016 12:01  Vitamin D, 25-Hydroxy Latest Ref Range: 30.0 - 100.0 ng/mL 21.9 (L)   Results for MASARU, CHAMBERLIN (MRN [id number]) as of 06/20/2016 14:53  Ref. Range 06/19/2016 12:01  CRP Latest Ref Range: <1.0 mg/dL 0.5  Results for HERNANDO, REALI (MRN [id number]) as of 06/20/2016 14:53  Ref. Range 06/19/2016 12:01  Sed Rate Latest Ref Range: 0 - 16 mm/hr 4  Results for OTHMAR, RINGER (MRN [id number]) as of 06/20/2016 14:53  Ref. Range 06/19/2016 12:02  Hemoglobin A1C Latest Ref Range: 4.8 - 5.6 % 6.9 (H)   Exam  RRN:HAFBXUX comfortably in bed, NAD  Lungs: breathing unlabored Cardiac: Regular  Ext:       Left Lower Extremity   Dressing c/d/i  Ext warm  + DP pulse  DPN, SPN, TN sensation grossly intact  EHL, FHL, AT, PT, peroneals, gastroc motor intact   Dystrophic nails    Assessment and Plan   POD/HD#: 1  70 y/o male s/p ROH and abx nail L tibia   -osteomyelitis L tibia, L tibia nonunion s/p ROH and abx nail placement  NWB L leg  PT/OT evals  Ice and elevate  ROM of L knee and ankle as tolerated  Dressing change on Monday     F/u on intra-op cultures  Likely home Monday   - Pain management:  Continue with current regimen   Scheduled tylenol    Oxy IR 10-20 mg po q6h prn    Dilaudid 0.5-1 mg IV q2h prn severe pain    Robaxin [phone] mg po q6h prn     - ABL anemia/Hemodynamics  Stable  Cbc in am   - Medical issues    home meds   - DVT/PE prophylaxis:  Lovenox while in hospital   ASA at dc   - ID:   vanc and zosyn   Adjust based off cultures  Will consult ID once cultures finalized   Will not order PICC at this time but pt may need  - Metabolic Bone  Disease:  Vitamin d deficiency    Supplement   Poor bone quality due to DM  - Activity:  NWB L leg   - FEN/GI prophylaxis/Foley/Lines:  Carb mod diet  KVO IVF  - Impediments to fracture healing:  DM  Osteomyelitis  - Dispo:  Continue with current care  Likely dc home Monday     Jari Pigg, PA-C [facility] [phone] (P) [phone] (O) 06/20/2016 2:50 PM

## 2016-06-20 NOTE — Consult Note (Signed)
Consult: urinary retention, difficult foley Requested by: Dr. Myrene GalasMichael Handy  History of Present Illness: 70 yo s/p left tibial procedure for tibial nonunion, osteomyelitis and foley could not be placed bu nurses. A wait and see approach was taken and pt needed to void this AM and could not. Nurse on floor tried a 12 Fr catheter and it would not pass. Blood was noted on each catheter pass. Bladder scan reported to me as showing 1 L.   Pt reports a good stream prior to admission and denies any h/o of urologic procedure, BPH, urethral sx.   Past Medical History:  Diagnosis Date  . CAD (coronary artery disease)    unspec site  . Cardiomyopathy, ischemic   . Congestive heart failure (HCC)    unspecified  . Diabetes mellitus without complication (HCC)   . HLD (hyperlipidemia)    mixed  . HTN (hypertension)    unspecified  . Sick sinus syndrome (HCC)    tachy-brday syndrome   Past Surgical History:  Procedure Laterality Date  . APPENDECTOMY    . FRACTURE SURGERY     left leg  . HERNIA REPAIR    . icd  5/07   for cardiomyopathy- [employer] 2 VR  . IMPLANTABLE CARDIOVERTER DEFIBRILLATOR (ICD) GENERATOR CHANGE N/A 07/18/2013   Procedure: ICD GENERATOR CHANGE;  Surgeon: Marinus MawGregg W Taylor, MD;  [facility];  Service: Cardiovascular;  Laterality: N/A;    Home Medications:  Prescriptions Prior to Admission  Medication Sig Dispense Refill Last Dose  . amiodarone (PACERONE) 200 MG tablet Take 1 tablet (200 mg total) by mouth daily. 90 tablet 3 06/19/2016 at 0700  . atorvastatin (LIPITOR) 40 MG tablet Take 40 mg by mouth daily.  3 06/18/2016 at Unknown time  . BYSTOLIC 10 MG tablet Take 10 mg by mouth daily.  3 06/19/2016 at 0700  . canagliflozin (INVOKANA) 100 MG TABS tablet Take 100 mg by mouth daily before breakfast.   06/18/2016 at Unknown time  . glimepiride (AMARYL) 4 MG tablet Take 4 mg by mouth daily with breakfast.   06/18/2016 at Unknown time  . metFORMIN (GLUCOPHAGE) 850 MG  tablet Take 850 mg by mouth 2 (two) times daily.     06/18/2016 at Unknown time  . oxycodone (ROXICODONE) 30 MG immediate release tablet Take 30 mg by mouth 3 (three) times daily as needed for pain.   0 06/19/2016 at 0700  . ramipril (ALTACE) 5 MG capsule Take 1 capsule (5 mg total) by mouth daily. 90 capsule 1 Taking  . fenofibrate 160 MG tablet Take 1 tablet (160 mg total) by mouth daily. With a meal (Patient not taking: Reported on 06/17/2016) 90 tablet 0 Not Taking at Unknown time   Allergies: No Known Allergies  History reviewed. No pertinent family history. Social History:  reports that he quit smoking about 13 years ago. His smoking use included Cigarettes. He has never used smokeless tobacco. He reports that he does not drink alcohol or use drugs.  ROS: A complete review of systems was performed.  All systems are negative except for pertinent findings as noted. Review of Systems  All other systems reviewed and are negative.    Physical Exam:  Vital signs in last 24 hours: Temp:  [97.7 F (36.5 C)-98.6 F (37 C)] 98.1 F (36.7 C) (09/29 0640) Pulse Rate:  [61-74] 70 (09/29 0640) Resp:  [14-18] 16 (09/29 0640) BP: (106-188)/(60-93) 124/71 (09/29 0640) SpO2:  [93 %-99 %] 98 % (09/29 0640)  Weight:  [77.1 kg (170 lb)] 77.1 kg (170 lb) (09/28 1052) General:  Alert and oriented, No acute distress HEENT: Normocephalic, atraumatic Cardiovascular: Regular rate and rhythm Lungs: Regular rate and effort Abdomen: Soft, nontender, nondistended, no abdominal masses; SP area full  Extremities: No edema Neurologic: Grossly intact GU: there was blood per meatus prior to my catheter attempt.   Procedure: I discussed with him the nature r/b of foley catheter placement and he elected to proceed. He was prepped and draped -- I gently passed an 18Fr coude catheter and it was tight from the start. It would not pass the bulb/membranous area.   Laboratory Data:  Results for orders placed or  performed during the hospital encounter of 06/19/16 (from the past 24 hour(s))  Glucose, capillary     Status: Abnormal   Collection Time: 06/19/16 11:00 AM  Result Value Ref Range   Glucose-Capillary 114 (H) 65 - 99 mg/dL  CBC WITH DIFFERENTIAL     Status: None   Collection Time: 06/19/16 12:01 PM  Result Value Ref Range   WBC 6.9 4.0 - 10.5 K/uL   RBC 5.59 4.22 - 5.81 MIL/uL   Hemoglobin 14.9 13.0 - 17.0 g/dL   HCT 95.6 21.3 - 08.6 %   MCV 85.7 78.0 - 100.0 fL   MCH 26.7 26.0 - 34.0 pg   MCHC 31.1 30.0 - 36.0 g/dL   RDW 57.8 46.9 - 62.9 %   Platelets 214 150 - 400 K/uL   Neutrophils Relative % 50 %   Neutro Abs 3.4 1.7 - 7.7 K/uL   Lymphocytes Relative 38 %   Lymphs Abs 2.6 0.7 - 4.0 K/uL   Monocytes Relative 8 %   Monocytes Absolute 0.5 0.1 - 1.0 K/uL   Eosinophils Relative 4 %   Eosinophils Absolute 0.3 0.0 - 0.7 K/uL   Basophils Relative 0 %   Basophils Absolute 0.0 0.0 - 0.1 K/uL  Comprehensive metabolic panel     Status: Abnormal   Collection Time: 06/19/16 12:01 PM  Result Value Ref Range   Sodium 139 135 - 145 mmol/L   Potassium 4.7 3.5 - 5.1 mmol/L   Chloride 106 101 - 111 mmol/L   CO2 26 22 - 32 mmol/L   Glucose, Bld 119 (H) 65 - 99 mg/dL   BUN 15 6 - 20 mg/dL   Creatinine, Ser 5.28 0.61 - 1.24 mg/dL   Calcium 9.5 8.9 - 41.3 mg/dL   Total Protein 7.0 6.5 - 8.1 g/dL   Albumin 4.0 3.5 - 5.0 g/dL   AST 24 15 - 41 U/L   ALT 18 17 - 63 U/L   Alkaline Phosphatase 61 38 - 126 U/L   Total Bilirubin 0.9 0.3 - 1.2 mg/dL   GFR calc non Af Amer >60 >60 mL/min   GFR calc Af Amer >60 >60 mL/min   Anion gap 7 5 - 15  Protime-INR     Status: None   Collection Time: 06/19/16 12:01 PM  Result Value Ref Range   Prothrombin Time 13.4 11.4 - 15.2 seconds   INR 1.02   APTT     Status: None   Collection Time: 06/19/16 12:01 PM  Result Value Ref Range   aPTT 33 24 - 36 seconds  VITAMIN D 25 Hydroxy (Vit-D Deficiency, Fractures)     Status: Abnormal   Collection Time:  06/19/16 12:01 PM  Result Value Ref Range   Vit D, 25-Hydroxy 21.9 (L) 30.0 - 100.0 ng/mL  C-reactive protein  Status: None   Collection Time: 06/19/16 12:01 PM  Result Value Ref Range   CRP 0.5 <1.0 mg/dL  Sedimentation rate     Status: None   Collection Time: 06/19/16 12:01 PM  Result Value Ref Range   Sed Rate 4 0 - 16 mm/hr  Magnesium     Status: None   Collection Time: 06/19/16 12:01 PM  Result Value Ref Range   Magnesium 1.7 1.7 - 2.4 mg/dL  Phosphorus     Status: None   Collection Time: 06/19/16 12:01 PM  Result Value Ref Range   Phosphorus 2.8 2.5 - 4.6 mg/dL  Prealbumin     Status: None   Collection Time: 06/19/16 12:01 PM  Result Value Ref Range   Prealbumin 27.9 18 - 38 mg/dL  Transferrin     Status: Abnormal   Collection Time: 06/19/16 12:01 PM  Result Value Ref Range   Transferrin 362 (H) 180 - 329 mg/dL  TSH     Status: None   Collection Time: 06/19/16 12:02 PM  Result Value Ref Range   TSH 3.756 0.350 - 4.500 uIU/mL  Hemoglobin A1c     Status: Abnormal   Collection Time: 06/19/16 12:02 PM  Result Value Ref Range   Hgb A1c MFr Bld 6.9 (H) 4.8 - 5.6 %   Mean Plasma Glucose 151 mg/dL  Type and screen Order type and screen if day of surgery is less than 15 days from draw of preadmission visit or order morning of surgery if day of surgery is greater than 6 days from preadmission visit.     Status: None   Collection Time: 06/19/16 12:05 PM  Result Value Ref Range   ABO/RH(D) A POS    Antibody Screen NEG    Sample Expiration 06/22/2016   ABO/Rh     Status: None   Collection Time: 06/19/16 12:05 PM  Result Value Ref Range   ABO/RH(D) A POS   Glucose, capillary     Status: Abnormal   Collection Time: 06/19/16  2:31 PM  Result Value Ref Range   Glucose-Capillary 106 (H) 65 - 99 mg/dL  Glucose, capillary     Status: Abnormal   Collection Time: 06/19/16 10:15 PM  Result Value Ref Range   Glucose-Capillary 140 (H) 65 - 99 mg/dL  CBC     Status: Abnormal    Collection Time: 06/19/16 11:48 PM  Result Value Ref Range   WBC 11.4 (H) 4.0 - 10.5 K/uL   RBC 4.54 4.22 - 5.81 MIL/uL   Hemoglobin 12.4 (L) 13.0 - 17.0 g/dL   HCT 60.4 (L) 54.0 - 98.1 %   MCV 85.7 78.0 - 100.0 fL   MCH 27.3 26.0 - 34.0 pg   MCHC 31.9 30.0 - 36.0 g/dL   RDW 19.1 47.8 - 29.5 %   Platelets 189 150 - 400 K/uL  Creatinine, serum     Status: None   Collection Time: 06/19/16 11:48 PM  Result Value Ref Range   Creatinine, Ser 0.95 0.61 - 1.24 mg/dL   GFR calc non Af Amer >60 >60 mL/min   GFR calc Af Amer >60 >60 mL/min   No results found for this or any previous visit (from the past 240 hour(s)). Creatinine:  Recent Labs  06/19/16 1201 06/19/16 2348  CREATININE 1.10 0.95    Impression/Assessment:  Urinary retention, gross hematuria --   Plan:  I discussed with the patient the nature, r/b/a to bedside cystoscopy and he elected to proceed.   Eloy Fehl,  Miyana Mordecai 06/20/2016, 7:31 AM

## 2016-06-20 NOTE — Progress Notes (Signed)
Patient was bladder scanned and residual was 988cc. PA on call is being paged for a coude catheter order to be placed.

## 2016-06-20 NOTE — Progress Notes (Signed)
Pt s/p cystoscopy, urethral stricture dilation, foley catheter placement -- full consult/procedure note to follow:   Rec: 1) keep foley for 5-7 days ... see me in office next week for void trial/foley removal if he is discharged 2) if he is not discharged with abx for osteo, keep on a nightly antibiotic (cephalexin 500 mg po qhs works well x 10 days), otherwise abx for osteo will likely cover for GU tract.

## 2016-06-20 NOTE — Progress Notes (Signed)
The son of the patient has asked that no more pain medication be given to the patient tonight. Family stated that the patient is too drowsy and something like tylenol should be given. He said once the patient is aware later tomorrow, only oxycodone should be given.

## 2016-06-20 NOTE — Procedures (Signed)
Indication: 70 year old with urinary retention, difficult Foley, gross hematuria after orthopedic surgery  Findings: Much of the penile bulb urethra looked rather ragged and erythematous. In the proximal bulb there was narrowing and a false passage consistent with the urethral stricture.  Description of procedure: After consent was obtained the flexible cystoscope was advanced per urethra. I was able to pass a sensor wire and negotiated into what I thought was the true lumen and it felt like it progresses normally and the bladder.  I discussed with the patient the findings and the nature risks and benefits of urethral dilation and Foley catheter placement knee elected to proceed.  A 12 [place] dilator was passed and went easily. Some initial read urine return which quickly cleared. I sequentially dilated up to 22 [place] with the 16 through the 22 [place] meeting some resistance. There was return of initial red then clear urine with each passage. I then advanced and 18 [place] council tip catheter over the wire and there was some resistance but when into the bladder. The wire was removed and again initial red urine which quickly turned clear. This was connected to gravity drainage. The balloon was inflated and seated at the bladder neck. He had quite a bit of urine output.

## 2016-06-20 NOTE — Care Management Note (Signed)
Case Management Note  Patient Details  Name: Karna DupesGulzar Lerman MRN: [id number] Date of Birth: September 12, 1946  Subjective/Objective:  Osteomyelitis Left Tibia, L tibia nonunion s/p ROH and abx nail placement , urinary retention, has Foley                Action/Plan: Discharge Planning: [facility] declining [facility] PT at this time. Requesting RW for home. Will need youth RW. Declines 3n1 bedside commode. Will contact [facility] on 06/21/2016 for RW for home. Waiting final recommendations for home.    Expected Discharge Date:                Expected Discharge Plan:  Home/Self Care  In-House Referral:  NA  Discharge planning Services  CM Consult  Post Acute Care Choice:  Home Health Choice offered to:  Patient  DME Arranged:  [facility] rolling DME Agency:  [facility]  [facility]  [facility]  NA  Status of Service:  Completed, signed off  If discussed at Long Length of Stay Meetings, dates discussed:    Additional Comments:  Elliot CousinShavis, Zakar Brosch Ellen, RN 06/20/2016, 5:33 PM

## 2016-06-20 NOTE — Progress Notes (Signed)
RN paged urologist on call for this patient. Urologist asked this RN to contact PA to place consult orders. Urologist states that he is on his way. Catheter cart to be by bedside.

## 2016-06-20 NOTE — Evaluation (Signed)
Physical Therapy Evaluation Patient Details Name: Christopher DupesGulzar Gorley MRN: [id number] DOB: Dec 11, 1945 Today's Date: 06/20/2016   History of Present Illness  70 y.o.male s/p ORIF and IMN of open L tibia fracture on Lt shoulder injury (non-operative) in [place] 06/2015. 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 PMH: HTN, diabetes, CHF, CAD, defibrillator.  Clinical Impression  Pt seen for initial evaluation and treatment including ambulation of 20 ft with rw (consistent NWB through LLE). Pt states that he lives with his son and his family and someone is always available to help him if needed. PT anticipating that the pt will d/c to home with family support. Will needed to attempt stairs prior to D/C.     Follow Up Recommendations Supervision for mobility/OOB;Home health PT    Equipment Recommendations  Rolling walker with 5" wheels    Recommendations for Other Services       Precautions / Restrictions Precautions Precautions: Fall Restrictions Weight Bearing Restrictions: Yes LLE Weight Bearing: Non weight bearing      Mobility  Bed Mobility Overal bed mobility: Needs Assistance Bed Mobility: Supine to Sit     Supine to sit: Min guard     General bed mobility comments: min guard for safety  Transfers Overall transfer level: Needs assistance Equipment used: Rolling walker (2 wheeled) Transfers: Sit to/from Stand Sit to Stand: Min guard         General transfer comment: consistent with NWB, stable with transfer  Ambulation/Gait Ambulation/Gait assistance: Min guard Ambulation Distance (Feet): 20 Feet Assistive device: Rolling walker (2 wheeled) Gait Pattern/deviations:  (swing-to pattern) Gait velocity: decreased   General Gait Details: pt maintaining NWB status during ambulation, no loss of balance  Stairs            Wheelchair Mobility    Modified Rankin (Stroke Patients  Only)       Balance Overall balance assessment: Needs assistance Sitting-balance support: No upper extremity supported Sitting balance-Leahy Scale: Good     Standing balance support: Bilateral upper extremity supported Standing balance-Leahy Scale: Poor Standing balance comment: using rw for support                             Pertinent Vitals/Pain Pain Assessment: 0-10 Pain Score: 3  Pain Location: Lt leg Pain Descriptors / Indicators: Sore Pain Intervention(s): Limited activity within patient's tolerance;Monitored during session    Home Living Family/patient expects to be discharged to:: Private residence Living Arrangements: Children Available Help at Discharge: Family;Available 24 hours/day Type of Home: House Home Access: Stairs to enter Entrance Stairs-Rails: None Entrance Stairs-Number of Steps: 2 Home Layout: One level Home Equipment: Gilmer MorCane - single point Additional Comments: Lives with son and his family.     Prior Function Level of Independence: Independent with assistive device(s)         Comments: used cane for ambulation     Hand Dominance        Extremity/Trunk Assessment   Upper Extremity Assessment: LUE deficits/detail       LUE Deficits / Details: reports injuring Lt UE during initial accident but using functionally during session   Lower Extremity Assessment: LLE deficits/detail   LLE Deficits / Details: able to move LLE without assistance     Communication   Communication: No difficulties  Cognition Arousal/Alertness: Awake/alert Behavior During Therapy: WFL for tasks assessed/performed Overall Cognitive Status: Within Functional Limits for  tasks assessed                      General Comments      Exercises     Assessment/Plan    PT Assessment Patient needs continued PT services  PT Problem List Decreased strength;Decreased range of motion;Decreased activity tolerance;Decreased balance;Decreased  mobility          PT Treatment Interventions DME instruction;Gait training;Stair training;Functional mobility training;Therapeutic activities;Therapeutic exercise;Patient/family education    PT Goals (Current goals can be found in the Care Plan section)  Acute Rehab PT Goals Patient Stated Goal: go home PT Goal Formulation: With patient Time For Goal Achievement: 07/04/16 Potential to Achieve Goals: Good    Frequency Min 5X/week   Barriers to discharge        Co-evaluation               End of Session Equipment Utilized During Treatment: Gait belt Activity Tolerance: Patient tolerated treatment well Patient left: in chair;with call bell/phone within reach Nurse Communication: Mobility status;Weight bearing status    Functional Assessment Tool Used: clinical judgment Functional Limitation: Mobility: Walking and moving around Mobility: Walking and Moving Around Current Status [phone]): At least 1 percent but less than 20 percent impaired, limited or restricted Mobility: Walking and Moving Around Goal Status [phone]): At least 1 percent but less than 20 percent impaired, limited or restricted    Time: 1030-1058 PT Time Calculation (min) (ACUTE ONLY): 28 min   Charges:   PT Evaluation $PT Eval Moderate Complexity: 1 Procedure PT Treatments $Gait Training: 8-22 mins   PT G Codes:   PT G-Codes **NOT FOR INPATIENT CLASS** Functional Assessment Tool Used: clinical judgment Functional Limitation: Mobility: Walking and moving around Mobility: Walking and Moving Around Current Status (K2706): At least 1 percent but less than 20 percent impaired, limited or restricted Mobility: Walking and Moving Around Goal Status [phone]): At least 1 percent but less than 20 percent impaired, limited or restricted    Christiane Ha, PT, CSCS Pager [phone] Office [phone]  06/20/2016, 1:07 PM

## 2016-06-20 NOTE — Progress Notes (Signed)
Spoke with PA on call- asked RN to try another catheter of a smaller size at this time. Verbal orders will be placed. Calling pediatric unit for a smaller size catheter.

## 2016-06-21 LAB — BASIC METABOLIC PANEL WITH GFR
Anion gap: 6 (ref 5–15)
BUN: 10 mg/dL (ref 6–20)
CO2: 28 mmol/L (ref 22–32)
Calcium: 8.6 mg/dL — ABNORMAL LOW (ref 8.9–10.3)
Chloride: 106 mmol/L (ref 101–111)
Creatinine, Ser: 1 mg/dL (ref 0.61–1.24)
GFR calc Af Amer: >60 mL/min
GFR calc non Af Amer: >60 mL/min
Glucose, Bld: 105 mg/dL — ABNORMAL HIGH (ref 65–99)
Potassium: 3.8 mmol/L (ref 3.5–5.1)
Sodium: 140 mmol/L (ref 135–145)

## 2016-06-21 LAB — CBC
HCT: 35.4 % — ABNORMAL LOW (ref 39.0–52.0)
Hemoglobin: 11 g/dL — ABNORMAL LOW (ref 13.0–17.0)
MCH: 26.6 pg (ref 26.0–34.0)
MCHC: 31.1 g/dL (ref 30.0–36.0)
MCV: 85.7 fL (ref 78.0–100.0)
Platelets: 169 K/uL (ref 150–400)
RBC: 4.13 MIL/uL — ABNORMAL LOW (ref 4.22–5.81)
RDW: 14.5 % (ref 11.5–15.5)
WBC: 8.8 K/uL (ref 4.0–10.5)

## 2016-06-21 LAB — HEMOGLOBIN A1C
Hgb A1c MFr Bld: 6.7 % — ABNORMAL HIGH (ref 4.8–5.6)
Mean Plasma Glucose: 146 mg/dL

## 2016-06-21 LAB — GLUCOSE, CAPILLARY
Glucose-Capillary: 104 mg/dL — ABNORMAL HIGH (ref 65–99)
Glucose-Capillary: 160 mg/dL — ABNORMAL HIGH (ref 65–99)
Glucose-Capillary: 146 mg/dL — ABNORMAL HIGH (ref 65–99)
Glucose-Capillary: 120 mg/dL — ABNORMAL HIGH (ref 65–99)

## 2016-06-21 LAB — SEX HORMONE BINDING GLOBULIN: Sex Hormone Binding: 43.8 nmol/L (ref 19.3–76.4)

## 2016-06-21 LAB — TESTOSTERONE: TESTOSTERONE: 336 ng/dL (ref 264–916)

## 2016-06-21 NOTE — Progress Notes (Signed)
Subjective: 2 Days Post-Op Procedure(s) (LRB): INTRAMEDULLARY (IM) NAIL TIBIAL ANTIBIOTIC NAIL (Left) HARDWARE REMOVAL LEFT TIBIAL (Left) Patient reports pain as mild.   No update in cultures so far negative growth will continue to monitor.  Objective: Vital signs in last 24 hours: Temp:  [98.3 F (36.8 C)-98.7 F (37.1 C)] 98.3 F (36.8 C) (09/30 0620) Pulse Rate:  [70-77] 72 (09/30 0620) Resp:  [17-18] 17 (09/30 0620) BP: (115-139)/(59-72) 139/72 (09/30 0620) SpO2:  [97 %-100 %] 97 % (09/30 0620)  Intake/Output from previous day: 09/29 0701 - 09/30 0700 In: 1360 [P.O.:560; I.V.:400; IV Piggyback:400] Out: 2200 [Urine:2200] Intake/Output this shift: Total I/O In: -  Out: 450 [Urine:450]   Recent Labs  06/19/16 1201 06/19/16 2348 06/21/16 0519  HGB 14.9 12.4* 11.0*    Recent Labs  06/19/16 2348 06/21/16 0519  WBC 11.4* 8.8  RBC 4.54 4.13*  HCT 38.9* 35.4*  PLT 189 169    Recent Labs  06/20/16 0615 06/21/16 0519  NA 137 140  K 4.5 3.8  CL 106 106  CO2 25 28  BUN 11 10  CREATININE 1.01 1.00  GLUCOSE 146* 105*  CALCIUM 8.8* 8.6*    Recent Labs  06/19/16 1201  INR 1.02    Neurovascular intact Sensation intact distally Intact pulses distally Dorsiflexion/Plantar flexion intact Incision: dressing C/D/I  Assessment/Plan: 2 Days Post-Op Procedure(s) (LRB): INTRAMEDULLARY (IM) NAIL TIBIAL ANTIBIOTIC NAIL (Left) HARDWARE REMOVAL LEFT TIBIAL (Left) Up with therapy  Continue current antibiotic regimen  Continue to follow for culture results dispo possibly Mon. lovenox dvt proph Pain control as ordered   [name] 06/21/2016, 11:47 AM

## 2016-06-21 NOTE — Progress Notes (Signed)
Physical Therapy Treatment Patient Details Name: [name] MRN: [id number] DOB: 04-Dec-1945 Today's Date: 06/21/2016    History of Present Illness 70 y.o.male s/p ORIF and IMN of open L tibia fracture on Lt shoulder injury (non-operative) in [place] 06/2015. 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 PMH: HTN, diabetes, CHF, CAD, defibrillator.    PT Comments    Pt making progress with mobility, able to increase ambulation distance today to 50 feet. Pt requesting use of standard walker as he does not like using a rolling walker (Modification to DME recommendation made). Discussed HHPT services with pt and he is declining at this time. PT to continue to follow, will need to attempt stairs prior to D/C home.   Follow Up Recommendations  Supervision for mobility/OOB;Home health PT (Recommending HHPT services but pt declines. )     Equipment Recommendations  Standard walker (youth height)    Recommendations for Other Services       Precautions / Restrictions Precautions Precautions: Fall Restrictions Weight Bearing Restrictions: Yes LLE Weight Bearing: Non weight bearing    Mobility  Bed Mobility Overal bed mobility: Needs Assistance Bed Mobility: Supine to Sit     Supine to sit: Min guard     General bed mobility comments: Pt using UEs to assist with management of LLE.   Transfers Overall transfer level: Needs assistance Equipment used: Standard walker Transfers: Sit to/from Stand Sit to Stand: Supervision         General transfer comment: consistent with NWB, stable with transfer  Ambulation/Gait Ambulation/Gait assistance: Min guard Ambulation Distance (Feet): 50 Feet Assistive device: Standard walker Gait Pattern/deviations:  (swing-to pattern) Gait velocity: decreased   General Gait Details: pt consistent with NWB status. Pt declines using rw, wanting to use standard  walker.    Stairs            Wheelchair Mobility    Modified Rankin (Stroke Patients Only)       Balance Overall balance assessment: Needs assistance Sitting-balance support: No upper extremity supported Sitting balance-Leahy Scale: Good     Standing balance support: Bilateral upper extremity supported Standing balance-Leahy Scale: Poor Standing balance comment: using rw                    Cognition Arousal/Alertness: Awake/alert Behavior During Therapy: WFL for tasks assessed/performed Overall Cognitive Status: Within Functional Limits for tasks assessed                      Exercises General Exercises - Lower Extremity Ankle Circles/Pumps: AAROM;Left;10 reps Heel Slides: AAROM;Left;10 reps    General Comments General comments (skin integrity, edema, etc.): Discussing ROM exercises with pt and need to perform in hospital and upon D/C. Pt verbalized understanding.       Pertinent Vitals/Pain Pain Assessment: Faces Faces Pain Scale: Hurts little more Pain Location: Lt leg Pain Descriptors / Indicators: Sore Pain Intervention(s): Limited activity within patient's tolerance;Monitored during session    Home Living                      Prior Function            PT Goals (current goals can now be found in the care plan section) Acute Rehab PT Goals Patient Stated Goal: go home PT Goal Formulation: With patient Time For Goal Achievement: 07/04/16 Potential to Achieve Goals: Good Progress towards PT goals:  Progressing toward goals    Frequency    Min 5X/week      PT Plan Equipment recommendations need to be updated    Co-evaluation             End of Session Equipment Utilized During Treatment: Gait belt Activity Tolerance: Patient tolerated treatment well Patient left: in chair;with call bell/phone within reach     Time: 0902-0926 PT Time Calculation (min) (ACUTE ONLY): 24 min  Charges:  $Gait Training: 8-22  mins $Therapeutic Exercise: 8-22 mins                    G Codes:      Christiane HaBenjamin J. Gal Feldhaus, PT, CSCS Pager [phone] Office [phone]  06/21/2016, 9:34 AM

## 2016-06-22 ENCOUNTER — Encounter (HOSPITAL_COMMUNITY): Payer: Self-pay | Admitting: *Deleted

## 2016-06-22 LAB — TESTOSTERONE, FREE: Testosterone, Free: 4.6 pg/mL — ABNORMAL LOW (ref 6.6–18.1)

## 2016-06-22 LAB — CBC
HCT: 38.7 % — ABNORMAL LOW (ref 39.0–52.0)
Hemoglobin: 11.9 g/dL — ABNORMAL LOW (ref 13.0–17.0)
MCH: 26.6 pg (ref 26.0–34.0)
MCHC: 30.7 g/dL (ref 30.0–36.0)
MCV: 86.4 fL (ref 78.0–100.0)
Platelets: 178 10*3/uL (ref 150–400)
RBC: 4.48 MIL/uL (ref 4.22–5.81)
RDW: 14.6 % (ref 11.5–15.5)
WBC: 9 10*3/uL (ref 4.0–10.5)

## 2016-06-22 LAB — BASIC METABOLIC PANEL
Anion gap: 7 (ref 5–15)
BUN: 7 mg/dL (ref 6–20)
CALCIUM: 8.9 mg/dL (ref 8.9–10.3)
CO2: 26 mmol/L (ref 22–32)
Chloride: 108 mmol/L (ref 101–111)
Creatinine, Ser: 0.94 mg/dL (ref 0.61–1.24)
GFR calc Af Amer: >60 mL/min (ref >60)
GLUCOSE: 120 mg/dL — AB (ref 65–99)
Potassium: 3.8 mmol/L (ref 3.5–5.1)
Sodium: 141 mmol/L (ref 135–145)

## 2016-06-22 LAB — GLUCOSE, CAPILLARY
GLUCOSE-CAPILLARY: 130 mg/dL — AB (ref 65–99)
GLUCOSE-CAPILLARY: 240 mg/dL — AB (ref 65–99)
GLUCOSE-CAPILLARY: 98 mg/dL (ref 65–99)
Glucose-Capillary: 141 mg/dL — ABNORMAL HIGH (ref 65–99)

## 2016-06-22 NOTE — Progress Notes (Signed)
Pharmacy Antibiotic Note  [name] is a 70 y.o. male admitted on 06/19/2016 with osteomyelitis. Pharmacy has been consulted for Vancomycin/Zosyn dosing. Pt with L tibial osteomyelitis with non-union now s/p OR 9/28 for hardware removal and nailing.  -SCr 0.94 and stable -WBC WNL, afebrile  -Nothing growing on cultures -Has been on vanc and zosyn D#4  Plan: -Continue vancomycin 750 mg IV q12h -Continue Zosyn 3.375G IV q8h to be infused over 4 hours -Trend WBC, temp, renal function  -VT tomorrow AM before 1000 AM dose  Antimicrobials this admission:  Vanc 9/28 >> Zosyn 9/28 >>  Dose adjustments this admission:  N/a  Microbiology results:  9/28 L Tibia Tissue: ngtd  Height: 5\' 5"  (165.1 cm) Weight: 170 lb (77.1 kg) IBW/kg (Calculated) : 61.5  Temp (24hrs), Avg:98.5 F (36.9 C), Min:98 F (36.7 C), Max:99.3 F (37.4 C)   Recent Labs Lab 06/19/16 1201 06/19/16 2348 06/20/16 0615 06/21/16 0519 06/22/16 0434  WBC 6.9 11.4*  --  8.8 9.0  CREATININE 1.10 0.95 1.01 1.00 0.94    Estimated Creatinine Clearance: 69 mL/min (by C-G formula based on SCr of 0.94 mg/dL).    No Known Allergies  Gwyndolyn KaufmanKai Stassi Fadely Bernette Redbird(Kenny), PharmD  PGY1 Pharmacy Resident Pager: [phone] 06/22/2016 10:38 AM

## 2016-06-22 NOTE — Progress Notes (Signed)
Physical Therapy Treatment Patient Details Name: [name] MRN: [id number] DOB: 1945-11-29 Today's Date: 06/22/2016    History of Present Illness 70 y.o.male s/p ORIF and IMN of open L tibia fracture on Lt shoulder injury (non-operative) in [place] 06/2015. 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 PMH: HTN, diabetes, CHF, CAD, defibrillator.    PT Comments    Pt continues to progress with mobility, able to ambulate 100 ft with standard walker. Distance limited by UE fatigue. Will attempt stairs at next session if able in anticipation of D/C to home once medically released.   Follow Up Recommendations  Supervision for mobility/OOB (pt declines HHPT services)     Equipment Recommendations  Standard walker    Recommendations for Other Services       Precautions / Restrictions Precautions Precautions: Fall Restrictions Weight Bearing Restrictions: Yes LLE Weight Bearing: Non weight bearing    Mobility  Bed Mobility Overal bed mobility: Needs Assistance Bed Mobility: Supine to Sit     Supine to sit: Supervision     General bed mobility comments: Using UEs to assist LLE to EOB  Transfers Overall transfer level: Needs assistance Equipment used: Standard walker Transfers: Sit to/from Stand Sit to Stand: Supervision         General transfer comment: consistent with NWB, stable with transfer  Ambulation/Gait Ambulation/Gait assistance: Min guard Ambulation Distance (Feet): 100 Feet Assistive device: Standard walker Gait Pattern/deviations:  (swing-to pattern) Gait velocity: decreased   General Gait Details: improving distance, consistent with NWB   Stairs            Wheelchair Mobility    Modified Rankin (Stroke Patients Only)       Balance Overall balance assessment: Needs assistance Sitting-balance support: No upper extremity supported Sitting balance-Leahy  Scale: Good     Standing balance support: Bilateral upper extremity supported Standing balance-Leahy Scale: Poor Standing balance comment: using rw                    Cognition Arousal/Alertness: Awake/alert Behavior During Therapy: WFL for tasks assessed/performed Overall Cognitive Status: Within Functional Limits for tasks assessed                      Exercises      General Comments        Pertinent Vitals/Pain Pain Assessment: Faces Faces Pain Scale: Hurts a little bit (pt reports only having a little pain) Pain Location: Lt leg Pain Descriptors / Indicators: Sore Pain Intervention(s): Limited activity within patient's tolerance;Monitored during session    Home Living                      Prior Function            PT Goals (current goals can now be found in the care plan section) Acute Rehab PT Goals Patient Stated Goal: go home PT Goal Formulation: With patient Time For Goal Achievement: 07/04/16 Potential to Achieve Goals: Good Progress towards PT goals: Progressing toward goals    Frequency    Min 5X/week      PT Plan Current plan remains appropriate    Co-evaluation             End of Session Equipment Utilized During Treatment: Gait belt Activity Tolerance: Patient tolerated treatment well Patient left: in chair;with call bell/phone within reach;with family/visitor present     Time: [id number]  PT Time Calculation (min) (ACUTE ONLY): 16 min  Charges:  $Gait Training: 8-22 mins                    G Codes:      Christiane Ha, PT, CSCS Pager [phone] Office [phone]  06/22/2016, 4:31 PM

## 2016-06-22 NOTE — Progress Notes (Signed)
Subjective: 3 Days Post-Op Procedure(s) (LRB): INTRAMEDULLARY (IM) NAIL TIBIAL ANTIBIOTIC NAIL (Left) HARDWARE REMOVAL LEFT TIBIAL (Left) Patient reports pain as mild.   Cultures still negative growth.  Objective: Vital signs in last 24 hours: Temp:  [98 F (36.7 C)-99.3 F (37.4 C)] 98.3 F (36.8 C) (10/01 0521) Pulse Rate:  [73-93] 73 (10/01 0521) Resp:  [16-18] 16 (10/01 0521) BP: (134-145)/(62-76) 145/72 (10/01 0521) SpO2:  [95 %-98 %] 98 % (10/01 0521)  Intake/Output from previous day: 09/30 0701 - 10/01 0700 In: -  Out: 2000 [Urine:2000] Intake/Output this shift: No intake/output data recorded.   Recent Labs  06/19/16 1201 06/19/16 2348 06/21/16 0519 06/22/16 0434  HGB 14.9 12.4* 11.0* 11.9*    Recent Labs  06/21/16 0519 06/22/16 0434  WBC 8.8 9.0  RBC 4.13* 4.48  HCT 35.4* 38.7*  PLT 169 178    Recent Labs  06/21/16 0519 06/22/16 0434  NA 140 141  K 3.8 3.8  CL 106 108  CO2 28 26  BUN 10 7  CREATININE 1.00 0.94  GLUCOSE 105* 120*  CALCIUM 8.6* 8.9    Recent Labs  06/19/16 1201  INR 1.02    Incision: dressing C/D/I  Assessment/Plan: 3 Days Post-Op Procedure(s) (LRB): INTRAMEDULLARY (IM) NAIL TIBIAL ANTIBIOTIC NAIL (Left) HARDWARE REMOVAL LEFT TIBIAL (Left) Up with therapy  Continue current antibiotic regimen  Continue to follow for culture results dispo possibly Mon. lovenox dvt proph Pain control as ordered  Lydia Meng 06/22/2016, 8:42 AM

## 2016-06-23 ENCOUNTER — Encounter (HOSPITAL_COMMUNITY): Payer: Self-pay | Admitting: Orthopedic Surgery

## 2016-06-23 DIAGNOSIS — I255 Ischemic cardiomyopathy: Secondary | ICD-10-CM

## 2016-06-23 DIAGNOSIS — S82201M Unspecified fracture of shaft of right tibia, subsequent encounter for open fracture type I or II with nonunion: Secondary | ICD-10-CM

## 2016-06-23 DIAGNOSIS — S82401M Unspecified fracture of shaft of right fibula, subsequent encounter for open fracture type I or II with nonunion: Secondary | ICD-10-CM

## 2016-06-23 HISTORY — DX: Unspecified fracture of shaft of right tibia, subsequent encounter for open fracture type I or II with nonunion: S82.201M

## 2016-06-23 HISTORY — DX: Ischemic cardiomyopathy: I25.5

## 2016-06-23 HISTORY — DX: Unspecified fracture of shaft of right tibia, subsequent encounter for open fracture type I or II with nonunion: S82.401M

## 2016-06-23 LAB — VANCOMYCIN, TROUGH: Vancomycin Tr: 14 ug/mL — ABNORMAL LOW (ref 15–20)

## 2016-06-23 LAB — GLUCOSE, CAPILLARY
Glucose-Capillary: 141 mg/dL — ABNORMAL HIGH (ref 65–99)
Glucose-Capillary: 242 mg/dL — ABNORMAL HIGH (ref 65–99)

## 2016-06-23 MED ORDER — CIPROFLOXACIN HCL 750 MG PO TABS: 750.0000 mg | ORAL_TABLET | Freq: Two times a day (BID) | ORAL | 0 refills | Status: DC

## 2016-06-23 MED ORDER — ASPIRIN 325 MG PO TBEC: 325.0000 mg | DELAYED_RELEASE_TABLET | Freq: Every day | ORAL | 0 refills | Status: DC

## 2016-06-23 MED ORDER — DOCUSATE SODIUM 100 MG PO CAPS: 100.0000 mg | ORAL_CAPSULE | Freq: Two times a day (BID) | ORAL | 0 refills | Status: DC

## 2016-06-23 MED ORDER — METHOCARBAMOL 500 MG PO TABS: 500.0000 mg | ORAL_TABLET | Freq: Three times a day (TID) | ORAL | 0 refills | Status: DC | PRN

## 2016-06-23 MED ORDER — TRAMADOL HCL 50 MG PO TABS: 50.0000 mg | ORAL_TABLET | Freq: Four times a day (QID) | ORAL | 0 refills | Status: DC | PRN

## 2016-06-23 MED ORDER — ACETAMINOPHEN 500 MG PO TABS: 500.0000 mg | ORAL_TABLET | Freq: Four times a day (QID) | ORAL | 0 refills | Status: DC | PRN

## 2016-06-23 MED ORDER — TRAMADOL HCL 50 MG PO TABS: 50.0000 mg | ORAL_TABLET | Freq: Four times a day (QID) | ORAL | Status: DC | PRN

## 2016-06-23 MED ORDER — ASPIRIN EC 325 MG PO TBEC
325.0000 mg | DELAYED_RELEASE_TABLET | Freq: Every day | ORAL | Status: DC
  Administered 2016-06-23: 325 mg via ORAL
  Filled 2016-06-23: qty 1

## 2016-06-23 NOTE — Care Management Important Message (Signed)
Important Message  Patient Details  Name: [name] MRN: [id number] Date of Birth: 12-16-1945   Medicare Important Message Given:  Yes    [name] 06/23/2016, 1:27 PM

## 2016-06-23 NOTE — Progress Notes (Signed)
[facility] Progress Note  Subjective  Doing well Ready to go home  No new concerns  Tolerating foley  ROS As above  Objective   BP 135/70 (BP Location: Left Arm)   Pulse 64   Temp 98 F (36.7 C) (Oral)   Resp 16   Ht 5\' 5"  (1.651 m)   Wt 77.1 kg (170 lb)   SpO2 97%   BMI 28.29 kg/m   Intake/Output      10/01 0701 - 10/02 0700 10/02 0701 - 10/03 0700   IV Piggyback 250    Total Intake(mL/kg) 250 (3.2)    Urine (mL/kg/hr) 1475 (0.8)    Total Output 1475     Net -1225            Labs  Intra-op cultures: no growth  CBG (last 3)   Recent Labs  06/22/16 2149 06/23/16 0644 06/23/16 1125  GLUCAP 98 141* 242*     Exam  Gen: sitting in bedside chair, NAD Lungs: breathing unlabored Cardiac: regular  Ext:       Left Lower Extremity   Dressings removed  Incisions healing well  Swelling well controlled  DPN, SPN, TN sensation intact  EHL, FHL, AT, PT, peroneals, gastroc motor intact   No DCT   Compartments are soft   Assessment and Plan   POD/HD#: 734  70 y/o male s/p ROH and abx nail L tibia    -osteomyelitis L tibia, L tibia nonunion s/p ROH and abx nail placement             NWB L leg             PT/OT evals             Ice and elevate             ROM of L knee and ankle as tolerated             Dressing changed   Ok to leave open to air   Ok to shower and wash with soap and water                              - Pain management:             Continue with current regimen                         Scheduled tylenol                          Oxy IR                         Robaxin [id number] mg po q6h prn                            I reviewed the [facility], pt had Oxycodone 30 mg tablets #90 filled on 06/20/2016. 30 day supply. I will not write for more oxycodone. I will provide pt with Rxs for tylenol, robaxin and ultram (ultram will be for breakthrough pain)  - ABL anemia/Hemodynamics             Stable    - Medical issues           home meds  - urinary retention, urethral stricture  Follow up with  urology at middle of this weeks  Appreciate their assistance   Pt will be sent home on cipro for osteo     - DVT/PE prophylaxis:             Lovenox while in hospital              ASA at dc    - ID:             dc iv abx  Will dc home on po cipro 750 mg po BID x 4 weeks  - Metabolic Bone Disease:             Vitamin d deficiency                          Supplement              Poor bone quality due to DM   - Activity:             NWB L leg    - FEN/GI prophylaxis/Foley/Lines:             Carb mod diet  Dc IV     - Impediments to fracture healing:             DM             Osteomyelitis  - Dispo:             Continue with current care             dc home today  Follow up with ortho in 10 days  Follow up with urology this week, I have called the office    Mearl Latin, PA-C [facility] [phone] (P) [phone] (O) 06/23/2016 1:41 PM

## 2016-06-23 NOTE — Progress Notes (Signed)
Discharge instructions went over with patient and patient stated understanding. IV removed.

## 2016-06-23 NOTE — Evaluation (Signed)
Occupational Therapy Evaluation Patient Details Name: Christopher DupesGulzar Hodges MRN: [id number] DOB: [birth year] Today's Date: 06/23/2016    History of Present Illness 70 y.o.male s/p ORIF and IMN of open L tibia fracture on Lt shoulder injury (non-operative) in [place] 06/2015. 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 PMH: HTN, diabetes, CHF, CAD, defibrillator.   Clinical Impression   PTA, pt reports independence with all ADL and IADL. He did use a cane for community mobility but ambulated independently in his home during ADL. Pt plans to D/C home with 24 hour supervision/assistance from his family. Discussed benefits of 3-in-1 for safety with shower transfer while maintaining NWB status of L lower extremity, but pt declined reporting that he has everything he needs and has been working with this injured leg for a year due to initial injury. Pt requires min assist for safety with tub transfer at this time and would benefit from further education concerning safety and NWB during ADLs. Will continue to follow acutely.    Follow Up Recommendations  No OT follow up;Supervision/Assistance - 24 hour    Equipment Recommendations  Other (comment) (Pt refused recommendation for 3-in-1)    Recommendations for Other Services       Precautions / Restrictions Precautions Precautions: Fall Restrictions Weight Bearing Restrictions: Yes LLE Weight Bearing: Non weight bearing      Mobility Bed Mobility Overal bed mobility: Needs Assistance Bed Mobility: Supine to Sit     Supine to sit: Supervision     General bed mobility comments: Up in chair upon PT arrival.   Transfers Overall transfer level: Needs assistance Equipment used: Standard walker Transfers: Sit to/from Stand Sit to Stand: Supervision         General transfer comment: consistent with NWB, stable with transfer; stood from chair x2.    Balance  Overall balance assessment: Needs assistance Sitting-balance support: Feet supported;No upper extremity supported Sitting balance-Leahy Scale: Good     Standing balance support: During functional activity Standing balance-Leahy Scale: Fair Standing balance comment: Able to remove hand from walker to brush teeth at sink with supervision.                            ADL Overall ADL's : Needs assistance/impaired Eating/Feeding: Supervision/ safety;Set up;Sitting   Grooming: Wash/dry hands;Supervision/safety;Set up;Standing   Upper Body Bathing: Supervision/ safety;Set up;Sitting   Lower Body Bathing: Minimal assistance;Sit to/from stand Lower Body Bathing Details (indicate cue type and reason): Min assist for reaching to R lower leg and foot. Upper Body Dressing : Supervision/safety;Set up;Sitting   Lower Body Dressing: Minimal assistance;Sit to/from stand Lower Body Dressing Details (indicate cue type and reason): Assist for threading RLE into pants and donning/doffing R sock. Toilet Transfer: Supervision/safety;Ambulation;Grab bars;RW   Toileting- ArchitectClothing Manipulation and Hygiene: Supervision/safety;Sit to/from stand   Tub/ Shower Transfer: Minimal assistance;Cueing for safety;Ambulation;Grab bars Tub/Shower Transfer Details (indicate cue type and reason): Attempted shower transfer and education concerning improved safety with this task when using 3-in-1, but pt declined reporting he has everything he needs at home.  Pt declined further practice with this task, stating that he has is able to do tub transfer by himself. Functional mobility during ADLs: Minimal assistance General ADL Comments: Pt educated concerning use of DME for safety in shower as well as dressing techniques and pt reports that he is able to do everything with help from family and does  not need any equipment.               Pertinent Vitals/Pain Pain Assessment: 0-10 Pain Score: 4  Faces Pain Scale:  Hurts a little bit Pain Location: LLE Pain Descriptors / Indicators: Sore;Operative site guarding Pain Intervention(s): Monitored during session;Repositioned     Hand Dominance Right   Extremity/Trunk Assessment Upper Extremity Assessment Upper Extremity Assessment: Overall WFL for tasks assessed   Lower Extremity Assessment Lower Extremity Assessment: LLE deficits/detail LLE Deficits / Details: Decreased ROM post-operatively; moving without assist       Communication Communication Communication: No difficulties   Cognition Arousal/Alertness: Awake/alert Behavior During Therapy: WFL for tasks assessed/performed Overall Cognitive Status: Within Functional Limits for tasks assessed                       Home Living Family/patient expects to be discharged to:: Private residence Living Arrangements: Children Available Help at Discharge: Family;Available 24 hours/day Type of Home: House Home Access: Stairs to enter [employer] of Steps: 2 Entrance Stairs-Rails: None Home Layout: One level     Bathroom Shower/Tub: Tub/shower unit Shower/tub characteristics: Engineer, building services: Standard Bathroom Accessibility: Yes How Accessible: Accessible via walker Home Equipment: [employer] - single point   Additional Comments: Lives with son and his family.       Prior Functioning/Environment Level of Independence: Independent with assistive device(s)        Comments: Cane for ambulation in community only        OT Problem List: Decreased strength;Decreased range of motion;Decreased activity tolerance;Decreased knowledge of use of DME or AE;Decreased knowledge of precautions;Pain   OT Treatment/Interventions: Self-care/ADL training;DME and/or AE instruction    OT Goals(Current goals can be found in the care plan section) Acute Rehab OT Goals Patient Stated Goal: go home OT Goal Formulation: With patient Time For Goal Achievement: 07/07/16 Potential to  Achieve Goals: Good  OT Frequency: Min 2X/week          End of Session Equipment Utilized During Treatment: Gait belt  Activity Tolerance: Patient tolerated treatment well Patient left: in chair;with call bell/phone within reach   Time: 1191-4782 OT Time Calculation (min): 29 min Charges:  OT General Charges $OT Visit: 1 Procedure OT Evaluation $OT Eval Moderate Complexity: 1 Procedure OT Treatments $Self Care/Home Management : 8-22 mins G-Codes:    Doristine Section, OTR/L [phone] 06/23/2016, 12:09 PM

## 2016-06-23 NOTE — Discharge Summary (Signed)
Orthopaedic Trauma Service (OTS)  Patient ID: [name] MRN: [id number] DOB/AGE: 1946-05-18 70 y.o.  Admit date: 06/19/2016 Discharge date: 06/23/2016  Admission Diagnoses: Hx of open fracture left tibia Hx of ORIF and IMN L tibia  Left tibia osteomyelitis Left tibia nonunion  Hx of VT withICD  CHF, systolic Ischemic cardiomyopathy CAD  HTN   Discharge Diagnoses:  Principal Problem:   Tibia and fibula open fracture, right, type I or II, with nonunion, subsequent encounter Active Problems:   Essential hypertension   Coronary atherosclerosis   SICK SINUS/ TACHY-BRADY SYNDROME   Congestive heart failure ([place])   Implantable cardioverter-defibrillator (ICD) in situ   Osteomyelitis ([place])   Ischemic cardiomyopathy   Procedures Performed:  06/19/2016- Dr. Marcelino Scot  Removal of hardware left tibia: plate and nail Placement of abx tibial nail  06/20/2016- Dr. Junious Silk  Cystoscopy Urethral dilation Foley catheter placement  Discharged Condition: good  Hospital Course:   70 year old male status post ORIF and intramedullary nailing of open left tibia fracture October 2016 in [place]. Patient was referred to our office by [facility] orthopedics for evaluation of his nonunion. Surgery was postponed until patient could receive cardiac clearance. Patient was taken to the OR on 06/11/2016 for the procedure noted above. Surgery went well without any significant issues however it did take a little bit longer than anticipated. At the end of the case attempt at Foley catheter placement was made however resistance was met on 2 occasions even with smaller catheter. As such a Foley was placed and we monitored the patient. Overnight the patient did have a urinary retention and urology was consult. Ultimately on the proceeding with a bedside cystoscopy with urethral irritation and Foley catheter placement. Aside from his urologic issues that patient did not have any additional perioperative  complications. He was laced on vancomycin and Zosyn for empiric coverage given his nonunion. We did not encounter any frank purulence intraoperatively. Patient was hospitalized evaluated final cultures. Ultimately on 06/23/2016 his culture did come back without any growth. Thus we did not send him out on IV antibiotics but we did send an oral antibiotics for 4 weeks for coverage.   patient will follow-up 3-5 days after discharge with urology for possible voiding trial.    The patient is also minimal there is large dose of opioids due to his nonunion and chronic pain. Hopeful that he'll be able to titrate down off of these. I did run him through the [place] controlled substance database which showed that his oxycodone 30 mg tablets refilled on 06/20/2016, #90. I did provide him with a prescription for Tylenol Robaxin and Ultram which is only to be used for breakthrough pain   Mild vitamin D deficiency noted on evaluation as well. We will start supplementation at his office follow-up  Consults: urology  Significant Diagnostic Studies: labs:  Results for RONY, RATZ (MRN [id number]) as of 06/23/2016 13:42  Ref. Range 06/22/2016 04:34  Sodium Latest Ref Range: 135 - 145 mmol/L 141  Potassium Latest Ref Range: 3.5 - 5.1 mmol/L 3.8  Chloride Latest Ref Range: 101 - 111 mmol/L 108  CO2 Latest Ref Range: 22 - 32 mmol/L 26  BUN Latest Ref Range: 6 - 20 mg/dL 7  Creatinine Latest Ref Range: 0.61 - 1.24 mg/dL 0.94  Calcium Latest Ref Range: 8.9 - 10.3 mg/dL 8.9  EGFR (Non-African Amer.) Latest Ref Range: >60 mL/min >60  EGFR (African American) Latest Ref Range: >60 mL/min >60  Glucose Latest Ref Range: 65 - 99  mg/dL 120 (H)  Anion gap Latest Ref Range: 5 - 15  7  WBC Latest Ref Range: 4.0 - 10.5 K/uL 9.0  RBC Latest Ref Range: 4.22 - 5.81 MIL/uL 4.48  Hemoglobin Latest Ref Range: 13.0 - 17.0 g/dL 11.9 (L)  HCT Latest Ref Range: 39.0 - 52.0 % 38.7 (L)  MCV Latest Ref Range: 78.0 - 100.0 fL 86.4   MCH Latest Ref Range: 26.0 - 34.0 pg 26.6  MCHC Latest Ref Range: 30.0 - 36.0 g/dL 30.7  RDW Latest Ref Range: 11.5 - 15.5 % 14.6  Platelets Latest Ref Range: 150 - 400 K/uL 178   Results for RUSSELL, QUINNEY (MRN [id number]) as of 06/23/2016 13:42  Ref. Range 06/19/2016 12:01  CRP Latest Ref Range: <1.0 mg/dL 0.5  Vitamin D, 25-Hydroxy Latest Ref Range: 30.0 - 100.0 ng/mL 21.9 (L)  Results for RUE, TINNEL (MRN [id number]) as of 06/23/2016 13:42  Ref. Range 06/19/2016 12:01  Sed Rate Latest Ref Range: 0 - 16 mm/hr 4  Results for DELANEY, SCHNICK (MRN [id number]) as of 06/23/2016 13:42  Ref. Range 06/19/2016 12:02  Hemoglobin A1C Latest Ref Range: 4.8 - 5.6 % 6.9 (H)   Treatments: IV hydration, antibiotics: vancomycin and Zosyn, analgesia: oyx ir, tylenol, anticoagulation: ASA and LMW heparin, therapies: PT, OT and RN, procedures: cystoscopy, urethral dilation and foley and surgery: as above   Discharge Exam:    Orthopaedic Trauma Service Progress Note   Subjective   Doing well Ready to go home  No new concerns   Tolerating foley   ROS As above   Objective    BP 135/70 (BP Location: Left Arm)   Pulse 64   Temp 98 F (36.7 C) (Oral)   Resp 16   Ht 5' 5"  (1.651 m)   Wt 77.1 kg (170 lb)   SpO2 97%   BMI 28.29 kg/m    Intake/Output      10/01 0701 - 10/02 0700 10/02 0701 - 10/03 0700   IV Piggyback 250    Total Intake(mL/kg) 250 (3.2)    Urine (mL/kg/hr) 1475 (0.8)    Total Output 1475     Net -1225             Labs   Intra-op cultures: no growth   CBG (last 3)   Recent Labs (last 2 labs)     Recent Labs   06/22/16 2149 06/23/16 0644 06/23/16 1125  GLUCAP 98 141* 242*          Exam   Gen: sitting in bedside chair, NAD Lungs: breathing unlabored Cardiac: regular  Ext:       Left Lower Extremity              Dressings removed             Incisions healing well             Swelling well controlled             DPN, SPN, TN sensation intact              EHL, FHL, AT, PT, peroneals, gastroc motor intact             No DCT              Compartments are soft    Assessment and Plan    POD/HD#: 73   70 y/o male s/p ROH and abx nail L tibia    -osteomyelitis L tibia, L tibia nonunion s/p  ROH and abx nail placement             NWB L leg             PT/OT              Ice and elevate             ROM of L knee and ankle as tolerated             Dressing changed                         Ok to leave open to air                         Ok to shower and wash with soap and water         - Pain management:             Continue with current regimen                         Scheduled tylenol                          Oxy IR                         Robaxin [phone] mg po q6h prn                I reviewed the [place], pt had Oxycodone 30 mg tablets #90 filled on 06/20/2016. 30 day supply. I will not write for more oxycodone. I will provide pt with Rxs for tylenol, robaxin and ultram (ultram will be for breakthrough pain)   - ABL anemia/Hemodynamics             Stable     - Medical issues               home meds   - urinary retention, urethral stricture             Follow up with urology at middle of this week             Appreciate their assistance               Pt will be sent home on cipro for osteo     - DVT/PE prophylaxis:             Lovenox while in hospital              ASA at dc    - ID:             dc iv abx             Will dc home on po cipro 750 mg po BID x 4 weeks   - Metabolic Bone Disease:             Vitamin d deficiency                          Supplement              Poor bone quality due to DM   - Activity:             NWB L leg    - FEN/GI prophylaxis/Foley/Lines:  Carb mod diet             Dc IV                - Impediments to fracture healing:             DM             Osteomyelitis  - Dispo:             Continue with current care             dc home today             Follow up with  ortho in 10 days             Follow up with urology this week, I have called the office    Disposition: 01-Home or Self Care  Discharge Instructions    Call MD / Call 911    Complete by:  As directed    If you experience chest pain or shortness of breath, CALL 911 and be transported to the hospital emergency room.  If you develope a fever above 101 F, pus (white drainage) or increased drainage or redness at the wound, or calf pain, call your surgeon's office.   Constipation Prevention    Complete by:  As directed    Drink plenty of fluids.  Prune juice may be helpful.  You may use a stool softener, such as Colace (over the counter) 100 mg twice a day.  Use MiraLax (over the counter) for constipation as needed.   Diet - low sodium heart healthy    Complete by:  As directed    Discharge instructions    Complete by:  As directed    Orthopaedic Trauma Service Discharge Instructions  General Discharge Instructions  WEIGHT BEARING STATUS: nonweightbearing left leg  RANGE OF MOTION/ACTIVITY: unrestricted range of motion left knee and ankle  Wound Care: see below. Daily wound care   Discharge Wound Care Instructions  Do NOT apply any ointments, solutions or lotions to pin sites or surgical wounds.  These prevent needed drainage and even though solutions like hydrogen peroxide kill bacteria, they also damage cells lining the pin sites that help fight infection.  Applying lotions or ointments can keep the wounds moist and can cause them to breakdown and open up as well. This can increase the risk for infection. When in doubt call the office.  Surgical incisions should be dressed daily.  If any drainage is noted, use one layer of adaptic, then gauze, Kerlix, and an ace wrap.  Once the incision is completely dry and without drainage, it may be left open to air out.  Showering may begin 36-48 hours later.  Cleaning gently with soap and water.  Traumatic wounds should be dressed daily as  well.    One layer of adaptic, gauze, Kerlix, then ace wrap.  The adaptic can be discontinued once the draining has ceased    If you have a wet to dry dressing: wet the gauze with saline the squeeze as much saline out so the gauze is moist (not soaking wet), place moistened gauze over wound, then place a dry gauze over the moist one, followed by Kerlix wrap, then ace wrap.    PAIN MEDICATION USE AND EXPECTATIONS  You have likely been given narcotic medications to help control your pain.  After a traumatic event that results in an fracture (broken bone) with or without surgery, it is ok to  use narcotic pain medications to help control one's pain.  We understand that everyone responds to pain differently and each individual patient will be evaluated on a regular basis for the continued need for narcotic medications. Ideally, narcotic medication use should last no more than 6-8 weeks (coinciding with fracture healing).   As a patient it is your responsibility as well to monitor narcotic medication use and report the amount and frequency you use these medications when you come to your office visit.   We would also advise that if you are using narcotic medications, you should take a dose prior to therapy to maximize you participation.  IF YOU ARE ON NARCOTIC MEDICATIONS IT IS NOT PERMISSIBLE TO OPERATE A MOTOR VEHICLE (MOTORCYCLE/CAR/TRUCK/MOPED) OR HEAVY MACHINERY DO NOT MIX NARCOTICS WITH OTHER CNS (CENTRAL NERVOUS SYSTEM) DEPRESSANTS SUCH AS ALCOHOL  Diet: as you were eating previously.  Can use over the counter stool softeners and bowel preparations, such as Miralax, to help with bowel movements.  Narcotics can be constipating.  Be sure to drink plenty of fluids    STOP SMOKING OR USING NICOTINE PRODUCTS!!!!  As discussed nicotine severely impairs your body's ability to heal surgical and traumatic wounds but also impairs bone healing.  Wounds and bone heal by forming microscopic blood vessels  (angiogenesis) and nicotine is a vasoconstrictor (essentially, shrinks blood vessels).  Therefore, if vasoconstriction occurs to these microscopic blood vessels they essentially disappear and are unable to deliver necessary nutrients to the healing tissue.  This is one modifiable factor that you can do to dramatically increase your chances of healing your injury.    (This means no smoking, no nicotine gum, patches, etc)  DO NOT USE NONSTEROIDAL ANTI-INFLAMMATORY DRUGS (NSAID'S)  Using products such as Advil (ibuprofen), Aleve (naproxen), Motrin (ibuprofen) for additional pain control during fracture healing can delay and/or prevent the healing response.  If you would like to take over the counter (OTC) medication, Tylenol (acetaminophen) is ok.  However, some narcotic medications that are given for pain control contain acetaminophen as well. Therefore, you should not exceed more than 4000 mg of tylenol in a day if you do not have liver disease.  Also note that there are may OTC medicines, such as cold medicines and allergy medicines that my contain tylenol as well.  If you have any questions about medications and/or interactions please ask your doctor/PA or your pharmacist.      ICE AND ELEVATE INJURED/OPERATIVE EXTREMITY  Using ice and elevating the injured extremity above your heart can help with swelling and pain control.  Icing in a pulsatile fashion, such as 20 minutes on and 20 minutes off, can be followed.    Do not place ice directly on skin. Make sure there is a barrier between to skin and the ice pack.    Using frozen items such as frozen peas works well as the conform nicely to the are that needs to be iced.  USE AN ACE WRAP OR TED HOSE FOR SWELLING CONTROL  In addition to icing and elevation, Ace wraps or TED hose are used to help limit and resolve swelling.  It is recommended to use Ace wraps or TED hose until you are informed to stop.    When using Ace Wraps start the wrapping distally  (farthest away from the body) and wrap proximally (closer to the body)   Example: If you had surgery on your leg or thing and you do not have a splint on, start the ace wrap  at the toes and work your way up to the thigh        If you had surgery on your upper extremity and do not have a splint on, start the ace wrap at your fingers and work your way up to the upper arm  IF YOU ARE IN A SPLINT OR CAST DO NOT Potlatch   If your splint gets wet for any reason please contact the office immediately. You may shower in your splint or cast as long as you keep it dry.  This can be done by wrapping in a cast cover or garbage back (or similar)  Do Not stick any thing down your splint or cast such as pencils, money, or hangers to try and scratch yourself with.  If you feel itchy take benadryl as prescribed on the bottle for itching  IF YOU ARE IN A CAM BOOT (BLACK BOOT)  You may remove boot periodically. Perform daily dressing changes as noted below.  Wash the liner of the boot regularly and wear a sock when wearing the boot. It is recommended that you sleep in the boot until told otherwise  CALL THE OFFICE WITH ANY QUESTIONS OR CONCERNS: [phone]   Increase activity slowly as tolerated    Complete by:  As directed    Non weight bearing    Complete by:  As directed    Laterality:  left   Extremity:  Lower       Medication List    TAKE these medications   acetaminophen 500 MG tablet Commonly known as:  TYLENOL Take 1-2 tablets (500-1,000 mg total) by mouth every 6 (six) hours as needed for mild pain or moderate pain.   amiodarone 200 MG tablet Commonly known as:  PACERONE Take 1 tablet (200 mg total) by mouth daily.   aspirin 325 MG EC tablet Take 1 tablet (325 mg total) by mouth daily.   atorvastatin 40 MG tablet Commonly known as:  LIPITOR Take 40 mg by mouth daily.   BYSTOLIC 10 MG tablet Generic drug:  nebivolol Take 10 mg by mouth daily.   docusate sodium 100 MG  capsule Commonly known as:  COLACE Take 1 capsule (100 mg total) by mouth 2 (two) times daily.   fenofibrate 160 MG tablet Take 1 tablet (160 mg total) by mouth daily. With a meal   glimepiride 4 MG tablet Commonly known as:  AMARYL Take 4 mg by mouth daily with breakfast.   GLUCOPHAGE 850 MG tablet Generic drug:  metFORMIN Take 850 mg by mouth 2 (two) times daily.   INVOKANA 100 MG Tabs tablet Generic drug:  canagliflozin Take 100 mg by mouth daily before breakfast.   methocarbamol 500 MG tablet Commonly known as:  ROBAXIN Take 1-2 tablets (500-1,000 mg total) by mouth every 8 (eight) hours as needed for muscle spasms.   oxycodone 30 MG immediate release tablet Commonly known as:  ROXICODONE Take 30 mg by mouth 3 (three) times daily as needed for pain.   ramipril 5 MG capsule Commonly known as:  ALTACE Take 1 capsule (5 mg total) by mouth daily.   traMADol 50 MG tablet Commonly known as:  ULTRAM Take 1 tablet (50 mg total) by mouth every 6 (six) hours as needed (breakthrough pain only).      Follow-up Information    HANDY,MICHAEL H, MD. Schedule an appointment as soon as possible for a visit in 10 day(s).   Specialty:  Orthopedic Surgery Contact information: [phone]  [address] [place] [address] [phone]        Festus Aloe, MD Follow up on 06/23/2016.   Specialty:  Urology Contact information: [place] [address] [phone]           Discharge Instructions and Plan:  70 y/o male s/p ROH and abx nail L tibia    -osteomyelitis L tibia, L tibia nonunion s/p ROH and abx nail placement             NWB L leg             PT/OT evals             Ice and elevate             ROM of L knee and ankle as tolerated             Dressing changed                         Ok to leave open to air                         Ok to shower and wash with soap and water         - Pain management:             Continue with current  regimen                         Scheduled tylenol                          Oxy IR                         Robaxin [phone] mg po q6h prn                I reviewed the [place], pt had Oxycodone 30 mg tablets #90 filled on 06/20/2016. 30 day supply. I will not write for more oxycodone. I will provide pt with Rxs for tylenol, robaxin and ultram (ultram will be for breakthrough pain)   - ABL anemia/Hemodynamics             Stable     - Medical issues               home meds   - urinary retention, urethral stricture             Follow up with urology at middle of this weeks             Appreciate their assistance               Pt will be sent home on cipro for osteo     - DVT/PE prophylaxis:             Lovenox while in hospital              ASA at dc    - ID:             dc iv abx             Will dc home on po cipro 750 mg po BID x 4 weeks   - Metabolic Bone Disease:             Vitamin d deficiency  Supplement              Poor bone quality due to DM   - Activity:             NWB L leg    - FEN/GI prophylaxis/Foley/Lines:             Carb mod diet             Dc IV                - Impediments to fracture healing:             DM             Osteomyelitis  - Dispo:             Continue with current care             dc home today             Follow up with ortho in 10 days             Follow up with urology this week, I have called the office   Signed:  Jari Pigg, PA-C [facility] [phone] (P) 06/23/2016, 2:13 PM

## 2016-06-23 NOTE — Progress Notes (Signed)
Pharmacy Antibiotic Note  [name] is a 70 y.o. male admitted on 06/19/2016 with osteomyelitis.  Pharmacy has been consulted for Vancomcyin dosing.  .  Plan: Continue Vancomycin at current dose Planned discharge home on Cipro PO this evening  Height: 5\' 5"  (165.1 cm) Weight: 170 lb (77.1 kg) IBW/kg (Calculated) : 61.5  Temp (24hrs), Avg:98.1 F (36.7 C), Min:97.3 F (36.3 C), Max:99 F (37.2 C)   Recent Labs Lab 06/19/16 1201 06/19/16 2348 06/20/16 0615 06/21/16 0519 06/22/16 0434 06/23/16 0935  WBC 6.9 11.4*  --  8.8 9.0  --   CREATININE 1.10 0.95 1.01 1.00 0.94  --   VANCOTROUGH  --   --   --   --   --  14*    Estimated Creatinine Clearance: 69 mL/min (by C-G formula based on SCr of 0.94 mg/dL).    No Known Allergies   Thank you for allowing pharmacy to be a part of this patient's care.   Marisue HumbleKendra Kylie Gros, PharmD Clinical Pharmacist [facility]

## 2016-06-23 NOTE — Progress Notes (Signed)
Physical Therapy Treatment Patient Details Name: [name] MRN: [id number] DOB: Mar 30, 1946 Today's Date: 06/23/2016    History of Present Illness 70 y.o.male s/p ORIF and IMN of open L tibia fracture on Lt shoulder injury (non-operative) in [place] 06/2015. 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 PMH: HTN, diabetes, CHF, CAD, defibrillator.    PT Comments    Patient progressing well towards PT goals. Initially pt placing weight through LLE during gait however able to maintain NWB with cues for technique. Performed stair training with family present. Pt eager to return home today. Encouraged elevation and exercises. Will follow.   Follow Up Recommendations  Supervision for mobility/OOB (pt declined HH services)     Equipment Recommendations  Standard walker    Recommendations for Other Services       Precautions / Restrictions Precautions Precautions: Fall Restrictions Weight Bearing Restrictions: Yes LLE Weight Bearing: Non weight bearing    Mobility  Bed Mobility Overal bed mobility: Needs Assistance Bed Mobility: Supine to Sit     Supine to sit: Supervision     General bed mobility comments: Up in chair upon PT arrival.   Transfers Overall transfer level: Needs assistance Equipment used: Standard walker Transfers: Sit to/from Stand Sit to Stand: Supervision         General transfer comment: consistent with NWB, stable with transfer; stood from chair x2.  Ambulation/Gait Ambulation/Gait assistance: Min guard Ambulation Distance (Feet): 100 Feet Assistive device: Standard walker Gait Pattern/deviations: Trunk flexed (swing-to) Gait velocity: decreased   General Gait Details: Initially pt placing weight throguh LLE however with cues and instruction to flex knee, pt consistent with NWB.   Stairs Stairs: Yes Stairs assistance: Min assist Stair Management:  Backwards;With walker Number of Stairs: 2 General stair comments: Cues for technique and safety. Family present to stabilize RW upon ascent. Cues to adhere to NWB LLE.  Wheelchair Mobility    Modified Rankin (Stroke Patients Only)       Balance Overall balance assessment: Needs assistance Sitting-balance support: Feet supported;No upper extremity supported Sitting balance-Leahy Scale: Good     Standing balance support: During functional activity Standing balance-Leahy Scale: Fair Standing balance comment: Able to remove hand from walker to brush teeth at sink with supervision.                    Cognition Arousal/Alertness: Awake/alert Behavior During Therapy: WFL for tasks assessed/performed Overall Cognitive Status: Within Functional Limits for tasks assessed                      Exercises      General Comments        Pertinent Vitals/Pain Pain Assessment: 0-10 Pain Score: 4  Faces Pain Scale: Hurts a little bit Pain Location: LLE Pain Descriptors / Indicators: Sore;Operative site guarding Pain Intervention(s): Monitored during session;Repositioned    Home Living Family/patient expects to be discharged to:: Private residence Living Arrangements: Children Available Help at Discharge: Family;Available 24 hours/day Type of Home: House Home Access: Stairs to enter Entrance Stairs-Rails: None Home Layout: One level Home Equipment: Gilmer MorCane - single point Additional Comments: Lives with son and his family.     Prior Function Level of Independence: Independent with assistive device(s)      Comments: Cane for ambulation in community only   PT Goals (current goals can now be found in the care plan section) Acute Rehab PT Goals Patient  Stated Goal: go home Progress towards PT goals: Progressing toward goals    Frequency    Min 5X/week      PT Plan Current plan remains appropriate    Co-evaluation             End of Session Equipment  Utilized During Treatment: Gait belt Activity Tolerance: Patient tolerated treatment well Patient left: in chair;with call bell/phone within reach;with family/visitor present     Time: 1610-9604 PT Time Calculation (min) (ACUTE ONLY): 20 min  Charges:  $Gait Training: 8-22 mins                    G Codes:      Davionne Dowty A Harper Vand[place]oort 06/23/2016, 11:30 AM Mylo Red, PT, DPT [phone]

## 2016-06-23 NOTE — Discharge Instructions (Addendum)
Orthopaedic Trauma Service Discharge Instructions  General Discharge Instructions  WEIGHT BEARING STATUS: nonweightbearing left leg  RANGE OF MOTION/ACTIVITY: unrestricted range of motion left knee and ankle  Wound Care: see below. Daily wound care   Discharge Wound Care Instructions  Do NOT apply any ointments, solutions or lotions to pin sites or surgical wounds.  These prevent needed drainage and even though solutions like hydrogen peroxide kill bacteria, they also damage cells lining the pin sites that help fight infection.  Applying lotions or ointments can keep the wounds moist and can cause them to breakdown and open up as well. This can increase the risk for infection. When in doubt call the office.  Surgical incisions should be dressed daily.  If any drainage is noted, use one layer of adaptic, then gauze, Kerlix, and an ace wrap.  Once the incision is completely dry and without drainage, it may be left open to air out.  Showering may begin 36-48 hours later.  Cleaning gently with soap and water.  Traumatic wounds should be dressed daily as well.    One layer of adaptic, gauze, Kerlix, then ace wrap.  The adaptic can be discontinued once the draining has ceased    If you have a wet to dry dressing: wet the gauze with saline the squeeze as much saline out so the gauze is moist (not soaking wet), place moistened gauze over wound, then place a dry gauze over the moist one, followed by Kerlix wrap, then ace wrap.    PAIN MEDICATION USE AND EXPECTATIONS  You have likely been given narcotic medications to help control your pain.  After a traumatic event that results in an fracture (broken bone) with or without surgery, it is ok to use narcotic pain medications to help control one's pain.  We understand that everyone responds to pain differently and each individual patient will be evaluated on a regular basis for the continued need for narcotic medications. Ideally, narcotic medication  use should last no more than 6-8 weeks (coinciding with fracture healing).   As a patient it is your responsibility as well to monitor narcotic medication use and report the amount and frequency you use these medications when you come to your office visit.   We would also advise that if you are using narcotic medications, you should take a dose prior to therapy to maximize you participation.  IF YOU ARE ON NARCOTIC MEDICATIONS IT IS NOT PERMISSIBLE TO OPERATE A MOTOR VEHICLE (MOTORCYCLE/CAR/TRUCK/MOPED) OR HEAVY MACHINERY DO NOT MIX NARCOTICS WITH OTHER CNS (CENTRAL NERVOUS SYSTEM) DEPRESSANTS SUCH AS ALCOHOL  Diet: as you were eating previously.  Can use over the counter stool softeners and bowel preparations, such as Miralax, to help with bowel movements.  Narcotics can be constipating.  Be sure to drink plenty of fluids    STOP SMOKING OR USING NICOTINE PRODUCTS!!!!  As discussed nicotine severely impairs your body's ability to heal surgical and traumatic wounds but also impairs bone healing.  Wounds and bone heal by forming microscopic blood vessels (angiogenesis) and nicotine is a vasoconstrictor (essentially, shrinks blood vessels).  Therefore, if vasoconstriction occurs to these microscopic blood vessels they essentially disappear and are unable to deliver necessary nutrients to the healing tissue.  This is one modifiable factor that you can do to dramatically increase your chances of healing your injury.    (This means no smoking, no nicotine gum, patches, etc)  DO NOT USE NONSTEROIDAL ANTI-INFLAMMATORY DRUGS (NSAID'S)  Using products such as Advil (ibuprofen),  Aleve (naproxen), Motrin (ibuprofen) for additional pain control during fracture healing can delay and/or prevent the healing response.  If you would like to take over the counter (OTC) medication, Tylenol (acetaminophen) is ok.  However, some narcotic medications that are given for pain control contain acetaminophen as well.  Therefore, you should not exceed more than 4000 mg of tylenol in a day if you do not have liver disease.  Also note that there are may OTC medicines, such as cold medicines and allergy medicines that my contain tylenol as well.  If you have any questions about medications and/or interactions please ask your doctor/PA or your pharmacist.      ICE AND ELEVATE INJURED/OPERATIVE EXTREMITY  Using ice and elevating the injured extremity above your heart can help with swelling and pain control.  Icing in a pulsatile fashion, such as 20 minutes on and 20 minutes off, can be followed.    Do not place ice directly on skin. Make sure there is a barrier between to skin and the ice pack.    Using frozen items such as frozen peas works well as the conform nicely to the are that needs to be iced.  USE AN ACE WRAP OR TED HOSE FOR SWELLING CONTROL  In addition to icing and elevation, Ace wraps or TED hose are used to help limit and resolve swelling.  It is recommended to use Ace wraps or TED hose until you are informed to stop.    When using Ace Wraps start the wrapping distally (farthest away from the body) and wrap proximally (closer to the body)   Example: If you had surgery on your leg or thing and you do not have a splint on, start the ace wrap at the toes and work your way up to the thigh        If you had surgery on your upper extremity and do not have a splint on, start the ace wrap at your fingers and work your way up to the upper arm  IF YOU ARE IN A SPLINT OR CAST DO NOT REMOVE IT FOR ANY REASON   If your splint gets wet for any reason please contact the office immediately. You may shower in your splint or cast as long as you keep it dry.  This can be done by wrapping in a cast cover or garbage back (or similar)  Do Not stick any thing down your splint or cast such as pencils, money, or hangers to try and scratch yourself with.  If you feel itchy take benadryl as prescribed on the bottle for itching  IF  YOU ARE IN A CAM BOOT (BLACK BOOT)  You may remove boot periodically. Perform daily dressing changes as noted below.  Wash the liner of the boot regularly and wear a sock when wearing the boot. It is recommended that you sleep in the boot until told otherwise  CALL THE OFFICE WITH ANY QUESTIONS OR CONCERNS: [phone]    Foley Catheter Care, Adult A Foley catheter is a soft, flexible tube. This tube is placed into your bladder to drain pee (urine). If you go home with this catheter in place, follow the instructions below. TAKING CARE OF THE CATHETER 1. Wash your hands with soap and water. 2. Put soap and water on a clean washcloth.  Clean the skin where the tube goes into your body.  Clean away from the tube site.  Never wipe toward the tube.  Clean the area using a circular  motion.  Remove all the soap. Pat the area dry with a clean towel. For males, reposition the skin that covers the end of the penis (foreskin). 3. Attach the tube to your leg with tape or a leg strap. Do not stretch the tube tight. If you are using tape, remove any stickiness left behind by past tape you used. 4. Keep the drainage bag below your hips. Keep it off the floor. 5. Check your tube during the day. Make sure it is working and draining. Make sure the tube does not curl, twist, or bend. 6. Do not pull on the tube or try to take it out. TAKING CARE OF THE DRAINAGE BAGS You will have a large overnight drainage bag and a small leg bag. You may wear the overnight bag any time. Never wear the small bag at night. Follow the directions below. Emptying the Drainage Bag Empty your drainage bag when it is  - full or at least 2-3 times a day. 1. Wash your hands with soap and water. 2. Keep the drainage bag below your hips. 3. Hold the dirty bag over the toilet or clean container. 4. Open the pour spout at the bottom of the bag. Empty the pee into the toilet or container. Do not let the pour spout touch  anything. 5. Clean the pour spout with a gauze pad or cotton ball that has rubbing alcohol on it. 6. Close the pour spout. 7. Attach the bag to your leg with tape or a leg strap. 8. Wash your hands well. Changing the Drainage Bag Change your bag once a month or sooner if it starts to smell or look dirty.  1. Wash your hands with soap and water. 2. Pinch the rubber tube so that pee does not spill out. 3. Disconnect the catheter tube from the drainage tube at the connection valve. Do not let the tubes touch anything. 4. Clean the end of the catheter tube with an alcohol wipe. Clean the end of a the drainage tube with a different alcohol wipe. 5. Connect the catheter tube to the drainage tube of the clean drainage bag. 6. Attach the new bag to the leg with tape or a leg strap. Avoid attaching the new bag too tightly. 7. Wash your hands well. Cleaning the Drainage Bag 1. Wash your hands with soap and water. 2. Wash the bag in warm, soapy water. 3. Rinse the bag with warm water. 4. Fill the bag with a mixture of white vinegar and water (1 cup vinegar to 1 quart warm water [.2 liter vinegar to 1 liter warm water]). Close the bag and soak it for 30 minutes in the solution. 5. Rinse the bag with warm water. 6. Hang the bag to dry with the pour spout open and hanging downward. 7. Store the clean bag (once it is dry) in a clean plastic bag. 8. Wash your hands well. PREVENT INFECTION  Wash your hands before and after touching your tube.  Take showers every day. Wash the skin where the tube enters your body. Do not take baths. Replace wet leg straps with dry ones, if this applies.  Do not use powders, sprays, or lotions on the genital area. Only use creams, lotions, or ointments as told by your doctor.  For females, wipe from front to back after going to the bathroom.  Drink enough fluids to keep your pee clear or pale yellow unless you are told not to have too much fluid (fluid  restriction).  Do not let the drainage bag or tubing touch or lie on the floor.  Wear cotton underwear to keep the area dry. GET HELP IF:  Your pee is cloudy or smells unusually bad.  Your tube becomes clogged.  You are not draining pee into the bag or your bladder feels full.  Your tube starts to leak. GET HELP RIGHT AWAY IF:  You have pain, puffiness (swelling), redness, or yellowish-white fluid (pus) where the tube enters the body.  You have pain in the belly (abdomen), legs, lower back, or bladder.  You have a fever.  You see blood fill the tube, or your pee is pink or red.  You feel sick to your stomach (nauseous), throw up (vomit), or have chills.  Your tube gets pulled out. MAKE SURE YOU:   Understand these instructions.  Will watch your condition.  Will get help right away if you are not doing well or get worse.   This information is not intended to replace advice given to you by your health care provider. Make sure you discuss any questions you have with your health care provider.   Document Released: 01/03/2013 Document Revised: 09/29/2014 Document Reviewed: 01/03/2013 Elsevier Interactive Patient Education [employer]

## 2016-06-24 ENCOUNTER — Encounter (HOSPITAL_COMMUNITY): Payer: Self-pay | Admitting: Orthopedic Surgery

## 2016-06-24 LAB — AEROBIC/ANAEROBIC CULTURE W GRAM STAIN (SURGICAL/DEEP WOUND)

## 2016-06-24 LAB — AEROBIC/ANAEROBIC CULTURE (SURGICAL/DEEP WOUND): CULTURE: NO GROWTH

## 2016-06-25 LAB — CALCITRIOL (1,25 DI-OH VIT D): VIT D 1 25 DIHYDROXY: 32.5 pg/mL (ref 19.9–79.3)

## 2016-06-25 NOTE — Anesthesia Postprocedure Evaluation (Signed)
Anesthesia Post Note  Patient: [name]  Procedure(s) Performed: Procedure(s) (LRB): INTRAMEDULLARY (IM) NAIL TIBIAL ANTIBIOTIC NAIL (Left) HARDWARE REMOVAL LEFT TIBIAL (Left)  Patient location during evaluation: PACU Anesthesia Type: General Level of consciousness: awake and alert and patient cooperative Pain management: pain level controlled Vital Signs Assessment: post-procedure vital signs reviewed and stable Respiratory status: spontaneous breathing and respiratory function stable Cardiovascular status: stable Anesthetic complications: no    Last Vitals:  Vitals:   06/23/16 0647 06/23/16 1435  BP: 135/70 (!) 160/83  Pulse: 64 68  Resp: 16   Temp: 36.7 C 36.8 C    Last Pain:  Vitals:   06/23/16 1435  TempSrc: Oral  PainSc:                  Kelly Ranieri S

## 2016-07-16 NOTE — Op Note (Signed)
NAMETAKEO, HARTS NO.:  [id number]  MEDICAL RECORD NO.:  [id number]  LOCATION:  5N16C                        FACILITY:  [facility]  PHYSICIAN:  Doralee Albino. Carola Frost, M.D. DATE OF BIRTH:  [birth year]  DATE OF PROCEDURE:  06/19/2016 DATE OF DISCHARGE:  06/23/2016                              OPERATIVE REPORT   PREOPERATIVE DIAGNOSES: 1. 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 3. Status post grade 3 open fracture. 4. Loose and broken may not be up to 4, but loose and broken maybe     just 3 loose and broken hardware.  PROCEDURES: 1. Partial excision, left tibia. 2. Intramedullary nailing of the left tibia with an antibiotic-     impregnated nail using tobramycin and vancomycin. 3. Removal of broken hardware, left tibia.  SURGEON:  Doralee Albino. Carola Frost, M.D.  ASSISTANT:  Mearl Latin, PA-C.  ANESTHESIA:  General.  COMPLICATIONS:  None.  I/O:  1050 in (IVF 800, colloid 250).  EBL:  300 mL.  SPECIMENS:  Reamings sent for anaerobic and aerobic cultures.  TOURNIQUET:  None.  DISPOSITION:  To PACU.  CONDITION:  Stable.  BRIEF SUMMARY OF INDICATIONS FOR PROCEDURE:  Mr. Calzadilla is a 70 year old male, who underwent intramedullary nailing and anterior tibial plating and I rack for a grade 3 open fracture.  The patient did have some wound issues and has been treated for infection previously.  He has had persistent pain and occasional redness as well as some prolonged drainage.  CT scan confirmed nonunion.  Sed rate, CRP had been elevated consistent with osteomyelitis, and he has been treated with suppression antibiotics without either healing or resolution of his symptoms.  I discussed with him the risks and benefits of surgical removal including the potential for failure to isolate the organism responsible for his infection, the need for staged procedure for removal of the infected nail, placement of an antibiotic nail, and then possible future  revision nailing.  We also discussed heart attack and stroke, and he has undergone cardiac clearance given his multiple comorbidities.  I have discussed this with his son as well, both wished to proceed.  BRIEF SUMMARY OF PROCEDURE:  The patient was taken to the operating room, where general anesthesia was induced.  His antibiotics were held both preoperatively for nearly a week as well as perioperatively until cultures could be obtained.  Began by reopening the anterior incision for the tibial plate, carried dissection down to this, identified it and removed it.  There were several broken screws and the easy out system had to be used to facilitate their removal.  We did not encounter any significant purulence during this portion of the procedure, but some fluid around the plate.  We then made the incision at the knee, carried dissection down, and interdigitated the extraction bolt.  This was difficult as we did not readily identify the make, but that it did end up being an old model [employer] nail.  We were able to remove the locking bolts and ultimately the nail.  The nail bolts were also broken, and this had to be treated by removal of the tip of the head and then carefully tapping  out the other portion and retrieving it from the lateral aspect of the leg.  We then began reaming and encountered some fibrinous and fibrous material.  This was sent for culture.  We then thoroughly irrigated the canal.  I did ream to over the size of the nail, and then following this we changed drapes.  The patient did receive vancomycin at that time.  Montez MoritaKeith Paul, [facility], assisted me throughout with all portions of this partial excision of the tibia as well as the removal of the broken hardware.  We took turns stabilizing the leg and removing the nail.  Attention was then turned to the back table, where using a chest tube and a guide rod, we created an intramedullary nail of sufficient size. This did take a  couple times as the nail diameter was smaller, and we then seated it to appropriate depth leaving the guidewire for retrieval. All wounds were then irrigated once more and closed in standard layered fashion using PDS and nylon suture.  Sterile gentle compressive dressing was applied.  The patient was awakened from the anesthesia and transported to PACU in stable condition.  Again, Mr. Renae Fickleaul assisted with all portions of the procedure including placement of the intramedullary nail and wound closure.  PROGNOSIS:  The patient will be partial weight bearing on the left with unrestricted range of motion of the knee and ankle.  We anticipate at least a 4-week course of antibiotics depending on culture results and will consult the Infectious Disease team at that time.  The plan at this point is for the fluoroquinolones.     Doralee AlbinoMichael H. Carola FrostHandy, M.D.     M[facility]/MEDQ  D:  07/15/2016  T:  07/16/2016  Job:  [id number]

## 2016-08-11 NOTE — H&P (Signed)
Orthopaedic Trauma Service H&P/Consult     Chief Complaint: retained antibiotic nail left tibia  HPI:   [name] is an 70 y.o. male.s/p abx nail placement almost 7 weeks ago. [facility] in left tibia.  Pt did complete 4 weeks of oral FQ for suspected osteomyelitis despite negative growth   Pt did have urologic issues during last admission requiring bedside cystoscopy and dilation. Pt has followed up with urology   Pt does have ICD device   Past Medical History:  Diagnosis Date  . CAD (coronary artery disease)    unspec site  . Cardiomyopathy, ischemic   . Congestive heart failure (HCC)    unspecified  . Diabetes mellitus without complication (HCC)   . Essential hypertension 02/03/2009   Qualifier: Diagnosis of  By: Bascom LevelsFrazier, RMA, Sherri    . HLD (hyperlipidemia)    mixed  . HTN (hypertension)    unspecified  . Ischemic cardiomyopathy 06/23/2016  . Sick sinus syndrome (HCC)    tachy-brday syndrome  . Tibia and fibula open fracture, right, type I or II, with nonunion, subsequent encounter 06/23/2016    Past Surgical History:  Procedure Laterality Date  . APPENDECTOMY    . FRACTURE SURGERY     left leg  . HARDWARE REMOVAL Left 06/19/2016   Procedure: HARDWARE REMOVAL LEFT TIBIAL;  Surgeon: Myrene GalasMichael Handy, MD;  [facility];  Service: Orthopedics;  Laterality: Left;  . HERNIA REPAIR    . icd  5/07   for cardiomyopathy- [employer] Atlas 2 VR  . IMPLANTABLE CARDIOVERTER DEFIBRILLATOR (ICD) GENERATOR CHANGE N/A 07/18/2013   Procedure: ICD GENERATOR CHANGE;  Surgeon: Marinus MawGregg W Taylor, MD;  [facility];  Service: Cardiovascular;  Laterality: N/A;  . INTRAMEDULLARY (IM) CEMENTED ANTIOBIOTIC NAIL Left 06/19/2016   Procedure: INTRAMEDULLARY (IM) NAIL TIBIAL ANTIBIOTIC NAIL;  Surgeon: Myrene GalasMichael Handy, MD;  Location: [facility] OR;  Service: Orthopedics;  Laterality: Left;    No family history on file. Social History:  reports that he quit smoking about  13 years ago. His smoking use included Cigarettes. He has never used smokeless tobacco. He reports that he does not drink alcohol or use drugs.  Allergies: No Known Allergies  Current Meds  Medication Sig  . acetaminophen (TYLENOL) 500 MG tablet Take 1-2 tablets (500-1,000 mg total) by mouth every 6 (six) hours as needed for mild pain or moderate pain.  Marland Kitchen. amiodarone (PACERONE) 200 MG tablet Take 1 tablet (200 mg total) by mouth daily.  Marland Kitchen. atorvastatin (LIPITOR) 40 MG tablet Take 40 mg by mouth daily.  Marland Kitchen. BYSTOLIC 10 MG tablet Take 10 mg by mouth daily.  . canagliflozin (INVOKANA) 100 MG TABS tablet Take 100 mg by mouth daily before breakfast.  . glimepiride (AMARYL) 4 MG tablet Take 4 mg by mouth daily with breakfast.  . metFORMIN (GLUCOPHAGE) 850 MG tablet Take 850 mg by mouth 2 (two) times daily.    . methocarbamol (ROBAXIN) 500 MG tablet Take 1-2 tablets (500-1,000 mg total) by mouth every 8 (eight) hours as needed for muscle spasms.  Marland Kitchen. oxycodone (ROXICODONE) 30 MG immediate release tablet Take 30 mg by mouth 3 (three) times daily as needed for pain.   . ramipril (ALTACE) 5 MG capsule Take 1 capsule (5 mg total) by mouth daily.    Labs pending   Review of Systems  Constitutional: Negative for chills and fever.  Respiratory: Negative for shortness of breath.   Cardiovascular: Negative for chest pain.  Gastrointestinal: Negative for abdominal pain, nausea and vomiting.  Neurological: Negative for tingling and sensory change.   Vitals on arrival to short stay  Physical Exam  Constitutional: He is oriented to person, place, and time. He appears well-developed and well-nourished.  Cardiovascular: Normal rate.   Pulmonary/Chest: Effort normal. No respiratory distress.  Musculoskeletal:  Left Lower extremity    Wounds healed     Motor and sensory functions grossly intact    Ext warm     + DP pulse     Swelling stable   Neurological: He is alert and oriented to person, place, and  time.      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 Mikhaela Zaugg, PA-C [facility] [phone] (P) 08/11/2016, 10:23 PM

## 2016-08-11 NOTE — Progress Notes (Signed)
Pt given pre-op instructions only; please complete assessment on DOS.  Pt instructed to take Amiodarone ( Pacerone) the morning of procedure; pt was having difficulty locating the medication. Pt made aware to not take any diabetes medications on morning of procedure. Pt verbalized that he is to only take Metformin tonight and no diabetes pills on morning of surgery such as Invokana, Metformin and Glimepiride. Pt made aware of diabetes protocol to check blood glucose (BG) every 2 hours prior to arrival, interventions for BG <70. Pt verbalized understanding of all pre-op instructions.

## 2016-08-12 ENCOUNTER — Inpatient Hospital Stay (HOSPITAL_COMMUNITY): Payer: Medicare Other | Admitting: Certified Registered Nurse Anesthetist

## 2016-08-12 ENCOUNTER — Inpatient Hospital Stay (HOSPITAL_COMMUNITY): Payer: Medicare Other

## 2016-08-12 ENCOUNTER — Ambulatory Visit (HOSPITAL_COMMUNITY)
Admission: RE | Admit: 2016-08-12 | Discharge: 2016-08-12 | Disposition: A | Discharge status: Home / Self Care | Payer: Medicare Other | Source: Ambulatory Visit | Attending: Orthopedic Surgery | Admitting: Orthopedic Surgery

## 2016-08-12 ENCOUNTER — Encounter (HOSPITAL_COMMUNITY): Payer: Self-pay | Admitting: Certified Registered Nurse Anesthetist

## 2016-08-12 ENCOUNTER — Encounter (HOSPITAL_COMMUNITY)
Admission: RE | Disposition: A | Discharge status: Home / Self Care | Payer: Self-pay | Source: Ambulatory Visit | Attending: Orthopedic Surgery

## 2016-08-12 DIAGNOSIS — Z472 Encounter for removal of internal fixation device: Secondary | ICD-10-CM | POA: Diagnosis not present

## 2016-08-12 DIAGNOSIS — I2510 Atherosclerotic heart disease of native coronary artery without angina pectoris: Secondary | ICD-10-CM | POA: Diagnosis not present

## 2016-08-12 DIAGNOSIS — I110 Hypertensive heart disease with heart failure: Secondary | ICD-10-CM | POA: Diagnosis not present

## 2016-08-12 DIAGNOSIS — Z87891 Personal history of nicotine dependence: Secondary | ICD-10-CM | POA: Diagnosis not present

## 2016-08-12 DIAGNOSIS — E782 Mixed hyperlipidemia: Secondary | ICD-10-CM | POA: Diagnosis not present

## 2016-08-12 DIAGNOSIS — I255 Ischemic cardiomyopathy: Secondary | ICD-10-CM | POA: Diagnosis not present

## 2016-08-12 DIAGNOSIS — E119 Type 2 diabetes mellitus without complications: Secondary | ICD-10-CM | POA: Diagnosis not present

## 2016-08-12 DIAGNOSIS — I509 Heart failure, unspecified: Secondary | ICD-10-CM | POA: Insufficient documentation

## 2016-08-12 DIAGNOSIS — Z419 Encounter for procedure for purposes other than remedying health state, unspecified: Secondary | ICD-10-CM

## 2016-08-12 DIAGNOSIS — X58XXXD Exposure to other specified factors, subsequent encounter: Secondary | ICD-10-CM | POA: Diagnosis not present

## 2016-08-12 DIAGNOSIS — S82401M Unspecified fracture of shaft of right fibula, subsequent encounter for open fracture type I or II with nonunion: Secondary | ICD-10-CM

## 2016-08-12 DIAGNOSIS — S82202K Unspecified fracture of shaft of left tibia, subsequent encounter for closed fracture with nonunion: Secondary | ICD-10-CM | POA: Diagnosis present

## 2016-08-12 DIAGNOSIS — Z95810 Presence of automatic (implantable) cardiac defibrillator: Secondary | ICD-10-CM | POA: Insufficient documentation

## 2016-08-12 DIAGNOSIS — Z7984 Long term (current) use of oral hypoglycemic drugs: Secondary | ICD-10-CM | POA: Insufficient documentation

## 2016-08-12 DIAGNOSIS — Z7982 Long term (current) use of aspirin: Secondary | ICD-10-CM | POA: Diagnosis not present

## 2016-08-12 DIAGNOSIS — S82201M Unspecified fracture of shaft of right tibia, subsequent encounter for open fracture type I or II with nonunion: Secondary | ICD-10-CM

## 2016-08-12 HISTORY — PX: TIBIA IM NAIL INSERTION: SHX2516

## 2016-08-12 LAB — POCT I-STAT, CHEM 8
BUN: 14 mg/dL (ref 6–20)
CALCIUM ION: 0.96 mmol/L — AB (ref 1.15–1.40)
Chloride: 110 mmol/L (ref 101–111)
Creatinine, Ser: 0.8 mg/dL (ref 0.61–1.24)
GLUCOSE: 113 mg/dL — AB (ref 65–99)
HCT: 42 % (ref 39.0–52.0)
HEMOGLOBIN: 14.3 g/dL (ref 13.0–17.0)
Potassium: 4.9 mmol/L (ref 3.5–5.1)
Sodium: 138 mmol/L (ref 135–145)
TCO2: 22 mmol/L (ref 0–100)

## 2016-08-12 LAB — GLUCOSE, CAPILLARY
GLUCOSE-CAPILLARY: 117 mg/dL — AB (ref 65–99)
Glucose-Capillary: 109 mg/dL — ABNORMAL HIGH (ref 65–99)

## 2016-08-12 SURGERY — INSERTION, INTRAMEDULLARY ROD, TIBIA
Anesthesia: General | Site: Leg Lower | Laterality: Left

## 2016-08-12 MED ORDER — SUGAMMADEX SODIUM 200 MG/2ML IV SOLN
INTRAVENOUS | Status: AC
  Filled 2016-08-12: qty 2

## 2016-08-12 MED ORDER — CHLORHEXIDINE GLUCONATE 4 % EX LIQD: 60.0000 mL | Freq: Once | CUTANEOUS | Status: DC

## 2016-08-12 MED ORDER — ROCURONIUM BROMIDE 10 MG/ML (PF) SYRINGE
PREFILLED_SYRINGE | INTRAVENOUS | Status: AC
  Filled 2016-08-12: qty 10

## 2016-08-12 MED ORDER — OXYCODONE HCL 10 MG PO TABS: 10.0000 mg | ORAL_TABLET | Freq: Three times a day (TID) | ORAL | 0 refills | Status: DC | PRN

## 2016-08-12 MED ORDER — SUGAMMADEX SODIUM 200 MG/2ML IV SOLN
INTRAVENOUS | Status: DC | PRN
  Administered 2016-08-12: 200 mg via INTRAVENOUS

## 2016-08-12 MED ORDER — LIDOCAINE 2% (20 MG/ML) 5 ML SYRINGE
INTRAMUSCULAR | Status: AC
  Filled 2016-08-12: qty 5

## 2016-08-12 MED ORDER — LACTATED RINGERS IV SOLN
INTRAVENOUS | Status: DC
  Administered 2016-08-12 (×2): via INTRAVENOUS

## 2016-08-12 MED ORDER — ONDANSETRON HCL 4 MG/2ML IJ SOLN
INTRAMUSCULAR | Status: AC
  Filled 2016-08-12: qty 8

## 2016-08-12 MED ORDER — PHENYLEPHRINE HCL 10 MG/ML IJ SOLN
INTRAVENOUS | Status: DC | PRN
  Administered 2016-08-12: 20 ug/min via INTRAVENOUS

## 2016-08-12 MED ORDER — ONDANSETRON HCL 4 MG/2ML IJ SOLN
INTRAMUSCULAR | Status: DC | PRN
  Administered 2016-08-12: 4 mg via INTRAVENOUS

## 2016-08-12 MED ORDER — HYDROMORPHONE HCL 2 MG/ML IJ SOLN
INTRAMUSCULAR | Status: AC
  Filled 2016-08-12: qty 1

## 2016-08-12 MED ORDER — MIDAZOLAM HCL 5 MG/5ML IJ SOLN
INTRAMUSCULAR | Status: DC | PRN
  Administered 2016-08-12: 2 mg via INTRAVENOUS

## 2016-08-12 MED ORDER — 0.9 % SODIUM CHLORIDE (POUR BTL) OPTIME
TOPICAL | Status: DC | PRN
  Administered 2016-08-12: 1000 mL

## 2016-08-12 MED ORDER — PROPOFOL 10 MG/ML IV BOLUS
INTRAVENOUS | Status: DC | PRN
  Administered 2016-08-12: 120 mg via INTRAVENOUS

## 2016-08-12 MED ORDER — ROCURONIUM BROMIDE 100 MG/10ML IV SOLN
INTRAVENOUS | Status: DC | PRN
  Administered 2016-08-12: 20 mg via INTRAVENOUS
  Administered 2016-08-12: 50 mg via INTRAVENOUS
  Administered 2016-08-12 (×2): 10 mg via INTRAVENOUS

## 2016-08-12 MED ORDER — PHENYLEPHRINE HCL 10 MG/ML IJ SOLN
INTRAMUSCULAR | Status: DC | PRN
  Administered 2016-08-12 (×2): 40 ug via INTRAVENOUS

## 2016-08-12 MED ORDER — LIDOCAINE HCL (CARDIAC) 20 MG/ML IV SOLN
INTRAVENOUS | Status: DC | PRN
  Administered 2016-08-12: 60 mg via INTRAVENOUS

## 2016-08-12 MED ORDER — PROPOFOL 10 MG/ML IV BOLUS
INTRAVENOUS | Status: AC
  Filled 2016-08-12: qty 20

## 2016-08-12 MED ORDER — CEFAZOLIN SODIUM-DEXTROSE 2-4 GM/100ML-% IV SOLN
2.0000 g | INTRAVENOUS | Status: AC
  Administered 2016-08-12: 2 g via INTRAVENOUS
  Filled 2016-08-12: qty 100

## 2016-08-12 MED ORDER — HYDROMORPHONE HCL 1 MG/ML IJ SOLN
0.2500 mg | INTRAMUSCULAR | Status: DC | PRN
  Administered 2016-08-12 (×3): 0.5 mg via INTRAVENOUS

## 2016-08-12 MED ORDER — FENTANYL CITRATE (PF) 100 MCG/2ML IJ SOLN
INTRAMUSCULAR | Status: AC
  Filled 2016-08-12: qty 2

## 2016-08-12 MED ORDER — EPHEDRINE 5 MG/ML INJ
INTRAVENOUS | Status: AC
  Filled 2016-08-12: qty 10

## 2016-08-12 MED ORDER — FENTANYL CITRATE (PF) 100 MCG/2ML IJ SOLN
INTRAMUSCULAR | Status: DC | PRN
  Administered 2016-08-12 (×7): 50 ug via INTRAVENOUS

## 2016-08-12 MED ORDER — FENTANYL CITRATE (PF) 100 MCG/2ML IJ SOLN
INTRAMUSCULAR | Status: AC
  Filled 2016-08-12: qty 4

## 2016-08-12 MED ORDER — TRAMADOL HCL 50 MG PO TABS: 50.0000 mg | ORAL_TABLET | Freq: Four times a day (QID) | ORAL | 0 refills | Status: DC | PRN

## 2016-08-12 MED ORDER — MIDAZOLAM HCL 2 MG/2ML IJ SOLN
INTRAMUSCULAR | Status: AC
  Filled 2016-08-12: qty 2

## 2016-08-12 MED ORDER — PHENYLEPHRINE 40 MCG/ML (10ML) SYRINGE FOR IV PUSH (FOR BLOOD PRESSURE SUPPORT)
PREFILLED_SYRINGE | INTRAVENOUS | Status: AC
  Filled 2016-08-12: qty 10

## 2016-08-12 MED ORDER — EPHEDRINE SULFATE-NACL 50-0.9 MG/10ML-% IV SOSY
PREFILLED_SYRINGE | INTRAVENOUS | Status: DC | PRN
  Administered 2016-08-12: 5 mg via INTRAVENOUS

## 2016-08-12 SURGICAL SUPPLY — 67 items
BANDAGE ACE 4X5 VEL STRL LF (GAUZE/BANDAGES/DRESSINGS) ×3 IMPLANT
BANDAGE ACE 6X5 VEL STRL LF (GAUZE/BANDAGES/DRESSINGS) ×3 IMPLANT
BANDAGE ELASTIC 4 VELCRO ST LF (GAUZE/BANDAGES/DRESSINGS) ×3 IMPLANT
BANDAGE ELASTIC 6 VELCRO ST LF (GAUZE/BANDAGES/DRESSINGS) ×3 IMPLANT
BIT DRILL 3.8X6 NS (BIT) ×3 IMPLANT
BIT DRILL 4.4 NS (BIT) ×3 IMPLANT
BLADE SURG 10 STRL SS (BLADE) ×6 IMPLANT
BNDG GAUZE ELAST 4 BULKY (GAUZE/BANDAGES/DRESSINGS) ×3 IMPLANT
BRUSH SCRUB DISP (MISCELLANEOUS) ×6 IMPLANT
CLOSURE WOUND 1/2 X4 (GAUZE/BANDAGES/DRESSINGS) ×1
COVER SURGICAL LIGHT HANDLE (MISCELLANEOUS) ×6 IMPLANT
DRAPE C-ARM 42X72 X-RAY (DRAPES) ×3 IMPLANT
DRAPE C-ARMOR (DRAPES) ×6 IMPLANT
DRAPE HALF SHEET 40X57 (DRAPES) ×6 IMPLANT
DRAPE IMP U-DRAPE 54X76 (DRAPES) ×3 IMPLANT
DRAPE INCISE IOBAN 66X45 STRL (DRAPES) ×3 IMPLANT
DRAPE U-SHAPE 47X51 STRL (DRAPES) ×3 IMPLANT
DRSG ADAPTIC 3X8 NADH LF (GAUZE/BANDAGES/DRESSINGS) ×3 IMPLANT
DRSG MEPITEL 4X7.2 (GAUZE/BANDAGES/DRESSINGS) ×3 IMPLANT
DRSG PAD ABDOMINAL 8X10 ST (GAUZE/BANDAGES/DRESSINGS) ×6 IMPLANT
ELECT REM PT RETURN 9FT ADLT (ELECTROSURGICAL) ×3
ELECTRODE REM PT RTRN 9FT ADLT (ELECTROSURGICAL) ×1 IMPLANT
EVACUATOR 1/8 PVC DRAIN (DRAIN) IMPLANT
GAUZE SPONGE 4X4 12PLY STRL (GAUZE/BANDAGES/DRESSINGS) ×3 IMPLANT
GLOVE BIO SURGEON STRL SZ7.5 (GLOVE) ×3 IMPLANT
GLOVE BIO SURGEON STRL SZ8 (GLOVE) ×3 IMPLANT
GLOVE BIO SURGEON STRL SZ8.5 (GLOVE) ×3 IMPLANT
GLOVE BIOGEL PI IND STRL 7.5 (GLOVE) ×1 IMPLANT
GLOVE BIOGEL PI IND STRL 8 (GLOVE) ×1 IMPLANT
GLOVE BIOGEL PI INDICATOR 7.5 (GLOVE) ×2
GLOVE BIOGEL PI INDICATOR 8 (GLOVE) ×2
GOWN STRL REUS W/ TWL LRG LVL3 (GOWN DISPOSABLE) ×2 IMPLANT
GOWN STRL REUS W/ TWL XL LVL3 (GOWN DISPOSABLE) ×1 IMPLANT
GOWN STRL REUS W/TWL LRG LVL3 (GOWN DISPOSABLE) ×6
GOWN STRL REUS W/TWL XL LVL3 (GOWN DISPOSABLE) ×2
GUIDEWIRE BALL NOSE 80CM (WIRE) ×3 IMPLANT
KIT BASIN OR (CUSTOM PROCEDURE TRAY) ×3 IMPLANT
KIT ROOM TURNOVER OR (KITS) ×3 IMPLANT
NAIL IM TIBIAL 10X34.5 (Orthopedic Implant) ×1 IMPLANT
NAIL TIBIAL 11MMX34.5CM (Nail) ×3 IMPLANT
PACK GENERAL/GYN (CUSTOM PROCEDURE TRAY) ×3 IMPLANT
PACK UNIVERSAL I (CUSTOM PROCEDURE TRAY) ×3 IMPLANT
PAD ARMBOARD 7.5X6 YLW CONV (MISCELLANEOUS) ×6 IMPLANT
PAD CAST 4YDX4 CTTN HI CHSV (CAST SUPPLIES) ×1 IMPLANT
PADDING CAST COTTON 4X4 STRL (CAST SUPPLIES) ×3
PADDING CAST COTTON 6X4 STRL (CAST SUPPLIES) ×3 IMPLANT
SCREW ACECAP 34MM (Screw) ×3 IMPLANT
SCREW PROXIMAL DEPUY (Screw) ×2 IMPLANT
SCREW PRXML FT 55X5.5XNS TIB (Screw) ×1 IMPLANT
SPONGE GAUZE 4X4 12PLY STER LF (GAUZE/BANDAGES/DRESSINGS) ×6 IMPLANT
STAPLER VISISTAT 35W (STAPLE) ×3 IMPLANT
STRIP CLOSURE SKIN 1/2X4 (GAUZE/BANDAGES/DRESSINGS) ×2 IMPLANT
SUCTION FRAZIER HANDLE 10FR (MISCELLANEOUS) ×2
SUCTION TUBE FRAZIER 10FR DISP (MISCELLANEOUS) ×1 IMPLANT
SUT ETHILON 2 0 FS 18 (SUTURE) ×6 IMPLANT
SUT VIC AB 0 CT1 27 (SUTURE)
SUT VIC AB 0 CT1 27XBRD ANBCTR (SUTURE) IMPLANT
SUT VIC AB 1 CT1 27 (SUTURE) ×2
SUT VIC AB 1 CT1 27XBRD ANBCTR (SUTURE) ×1 IMPLANT
SUT VIC AB 2-0 CT1 27 (SUTURE) ×2
SUT VIC AB 2-0 CT1 TAPERPNT 27 (SUTURE) ×1 IMPLANT
SUT VIC AB 2-0 CT3 27 (SUTURE) IMPLANT
TIBIAL NAIL 10X34.5 (Orthopedic Implant) ×3 IMPLANT
TOWEL OR 17X24 6PK STRL BLUE (TOWEL DISPOSABLE) ×3 IMPLANT
TOWEL OR 17X26 10 PK STRL BLUE (TOWEL DISPOSABLE) ×6 IMPLANT
TRAY FOLEY CATH 16FRSI W/METER (SET/KITS/TRAYS/PACK) ×3 IMPLANT
YANKAUER SUCT BULB TIP NO VENT (SUCTIONS) ×6 IMPLANT

## 2016-08-12 NOTE — Discharge Instructions (Addendum)
[facility]th[facility]edic Trauma Service Discharge Instructions   General Discharge Instructions  WEIGHT BEARING STATUS: Weightbearing as tolerated left leg   RANGE OF MOTION/ACTIVITY:Range of motion as tolerated left knee and ankle  Wound Care: daily wound care starting on 08/14/2016. See instructions below    Discharge Wound Care Instructions  Do NOT apply any ointments, solutions or lotions to pin sites or surgical wounds.  These prevent needed drainage and even though solutions like hydrogen peroxide kill bacteria, they also damage cells lining the pin sites that help fight infection.  Applying lotions or ointments can keep the wounds moist and can cause them to breakdown and open up as well. This can increase the risk for infection. When in doubt call the office.  Surgical incisions should be dressed daily.  If any drainage is noted, use one layer of adaptic, then gauze, Kerlix, and an ace wrap.  Once the incision is completely dry and without drainage, it may be left open to air out.  Showering may begin 36-48 hours later.  Cleaning gently with soap and water.  Traumatic wounds should be dressed daily as well.    One layer of adaptic, gauze, Kerlix, then ace wrap.  The adaptic can be discontinued once the draining has ceased    If you have a wet to dry dressing: wet the gauze with saline the squeeze as much saline out so the gauze is moist (not soaking wet), place moistened gauze over wound, then place a dry gauze over the moist one, followed by Kerlix wrap, then ace wrap.  PAIN MEDICATION USE AND EXPECTATIONS  You have likely been given narcotic medications to help control your pain.  After a traumatic event that results in an fracture (broken bone) with or without surgery, it is ok to use narcotic pain medications to help control one's pain.  We understand that everyone responds to pain differently and each individual patient will be evaluated on a regular basis for the continued need for  narcotic medications. Ideally, narcotic medication use should last no more than 6-8 weeks (coinciding with fracture healing).   As a patient it is your responsibility as well to monitor narcotic medication use and report the amount and frequency you use these medications when you come to your office visit.   We would also advise that if you are using narcotic medications, you should take a dose prior to therapy to maximize you participation.  IF YOU ARE ON NARCOTIC MEDICATIONS IT IS NOT PERMISSIBLE TO OPERATE A MOTOR VEHICLE (MOTORCYCLE/CAR/TRUCK/MOPED) OR HEAVY MACHINERY DO NOT MIX NARCOTICS WITH OTHER CNS (CENTRAL NERVOUS SYSTEM) DEPRESSANTS SUCH AS ALCOHOL  Diet: as you were eating previously.  Can use over the counter stool softeners and bowel preparations, such as Miralax, to help with bowel movements.  Narcotics can be constipating.  Be sure to drink plenty of fluids    STOP SMOKING OR USING NICOTINE PRODUCTS!!!!  As discussed nicotine severely impairs your body's ability to heal surgical and traumatic wounds but also impairs bone healing.  Wounds and bone heal by forming microscopic blood vessels (angiogenesis) and nicotine is a vasoconstrictor (essentially, shrinks blood vessels).  Therefore, if vasoconstriction occurs to these microscopic blood vessels they essentially disappear and are unable to deliver necessary nutrients to the healing tissue.  This is one modifiable factor that you can do to dramatically increase your chances of healing your injury.    (This means no smoking, no nicotine gum, patches, etc)  DO NOT USE NONSTEROIDAL ANTI-INFLAMMATORY DRUGS (NSAID'S)  Using products such as Advil (ibuprofen), Aleve (naproxen), Motrin (ibuprofen) for additional pain control during fracture healing can delay and/or prevent the healing response.  If you would like to take over the counter (OTC) medication, Tylenol (acetaminophen) is ok.  However, some narcotic medications that are given for  pain control contain acetaminophen as well. Therefore, you should not exceed more than 4000 mg of tylenol in a day if you do not have liver disease.  Also note that there are may OTC medicines, such as cold medicines and allergy medicines that my contain tylenol as well.  If you have any questions about medications and/or interactions please ask your doctor/PA or your pharmacist.      ICE AND ELEVATE INJURED/OPERATIVE EXTREMITY  Using ice and elevating the injured extremity above your heart can help with swelling and pain control.  Icing in a pulsatile fashion, such as 20 minutes on and 20 minutes off, can be followed.    Do not place ice directly on skin. Make sure there is a barrier between to skin and the ice pack.    Using frozen items such as frozen peas works well as the conform nicely to the are that needs to be iced.  USE AN ACE WRAP OR TED HOSE FOR SWELLING CONTROL  In addition to icing and elevation, Ace wraps or TED hose are used to help limit and resolve swelling.  It is recommended to use Ace wraps or TED hose until you are informed to stop.    When using Ace Wraps start the wrapping distally (farthest away from the body) and wrap proximally (closer to the body)   Example: If you had surgery on your leg or thing and you do not have a splint on, start the ace wrap at the toes and work your way up to the thigh        If you had surgery on your upper extremity and do not have a splint on, start the ace wrap at your fingers and work your way up to the upper arm  IF YOU ARE IN A SPLINT OR CAST DO NOT REMOVE IT FOR ANY REASON   If your splint gets wet for any reason please contact the office immediately. You may shower in your splint or cast as long as you keep it dry.  This can be done by wrapping in a cast cover or garbage back (or similar)  Do Not stick any thing down your splint or cast such as pencils, money, or hangers to try and scratch yourself with.  If you feel itchy take benadryl as  prescribed on the bottle for itching  IF YOU ARE IN A CAM BOOT (BLACK BOOT)  You may remove boot periodically. Perform daily dressing changes as noted below.  Wash the liner of the boot regularly and wear a sock when wearing the boot. It is recommended that you sleep in the boot until told otherwise  CALL THE OFFICE WITH ANY QUESTIONS OR CONCERNS: [phone]

## 2016-08-12 NOTE — Transfer of Care (Signed)
Immediate Anesthesia Transfer of Care Note  Patient: [name]  Procedure(s) Performed: Procedure(s): INTRAMEDULLARY (IM) NAIL TIBIAL/REMOVAL ANTIBOTIC NAIL (Left)  Patient Location: PACU  Anesthesia Type:General  Level of Consciousness: awake, alert  and oriented  Airway & Oxygen Therapy: Patient Spontanous Breathing and Patient connected to nasal cannula oxygen  Post-op Assessment: Report given to RN and Post -op Vital signs reviewed and stable  Post vital signs: Reviewed and stable  Last Vitals:  Vitals:   08/12/16 0901  BP: (!) 183/93  Pulse: 66  Resp: 20  Temp: 36.3 C    Last Pain:  Vitals:   08/12/16 0901  TempSrc: Oral      Patients Stated Pain Goal: 3 (08/12/16 1027)  Complications: No apparent anesthesia complications

## 2016-08-12 NOTE — Anesthesia Preprocedure Evaluation (Signed)
Anesthesia Evaluation  Patient identified by MRN, date of birth, ID band Patient awake    Reviewed: Allergy & Precautions, NPO status , Patient's Chart, lab work & pertinent test results, reviewed documented beta blocker date and time   History of Anesthesia Complications Negative for: history of anesthetic complications  Airway Mallampati: III  TM Distance: >3 FB Neck ROM: Full    Dental  (+) Dental Advisory Given,  Cap on top right tooth:   Pulmonary neg shortness of breath, neg sleep apnea, neg COPD, neg recent URI, former smoker,    Pulmonary exam normal breath sounds clear to auscultation       Cardiovascular hypertension, Pt. on medications and Pt. on home beta blockers (-) angina+ CAD, +CHF (EF 30-35%, grade I diastolic dysfunction) and + Orthopnea (2 pillows)  (-) Past MI and (-) Cardiac Stents + dysrhythmias (sick sinus syndrome) + Cardiac Defibrillator (St. Jude; last interrogated 05/20/2016 (no evidence of ventricular arrhythmias)) II Rhythm:Regular Rate:Normal  HLD  EKG 05/20/2016: NSR, anteroseptal infarct (age undetermined)  TTE 05/20/2016: Study Conclusions  - Left ventricle: The cavity size was normal. Wall thickness was   normal. Systolic function was moderately to severely reduced. The   estimated ejection fraction was in the range of 30% to 35%.   Diffuse hypokinesis. There is akinesis of the   mid-apicalanteroseptal and apical myocardium. Doppler parameters   are consistent with abnormal left ventricular relaxation (grade 1   diastolic dysfunction).  Impressions:  - Diffuse hypokinesis with akinesis of the distal anteroseptal wall   and apex; overall moderate to severe reduction in LV function;   grade 1 diastolic dysfunction.  02/07/14; ETT Conclusions Poor exercise tolerance for age Prolonged HR and BP recovery. No chest pain. THR was achieved. Duke Treadmill Score: +5 No exercise induced ischemic  EKG changes noted at given workload   Neuro/Psych negative neurological ROS     GI/Hepatic negative GI ROS, Neg liver ROS,   Endo/Other  diabetes, Type 2, Oral Hypoglycemic Agents  Renal/GU negative Renal ROS     Musculoskeletal   Abdominal   Peds  Hematology negative hematology ROS (+)   Anesthesia Other Findings   Reproductive/Obstetrics                             Anesthesia Physical  Anesthesia Plan  ASA: III  Anesthesia Plan: General   Post-op Pain Management:    Induction: Intravenous  Airway Management Planned: Oral ETT  Additional Equipment:   Intra-op Plan:   Post-operative Plan: Extubation in OR  Informed Consent: I have reviewed the patients History and Physical, chart, labs and discussed the procedure including the risks, benefits and alternatives for the proposed anesthesia with the patient or authorized representative who has indicated his/her understanding and acceptance.   Dental advisory given  Plan Discussed with: CRNA  Anesthesia Plan Comments: (Risks of general anesthesia discussed including, but not limited to, sore throat, hoarse voice, chipped/damaged teeth, injury to vocal cords, nausea and vomiting, allergic reactions, lung infection, heart attack, stroke, and death. All questions answered.  Discussed the benefits of performing nerve blocks for post-operative pain. The patient stated that he didn't have blocks for his previous surgery and does not want them for this surgery. )        Anesthesia Quick Evaluation

## 2016-08-12 NOTE — Anesthesia Procedure Notes (Signed)
Procedure Name: Intubation Date/Time: 08/12/2016 11:37 AM Performed by: Jed LimerickHARDER, Nakaiya Beddow S Pre-anesthesia Checklist: Patient identified, Emergency Drugs available, Suction available and Patient being monitored Patient Re-evaluated:Patient Re-evaluated prior to inductionOxygen Delivery Method: Circle System Utilized Preoxygenation: Pre-oxygenation with 100% oxygen Intubation Type: IV induction Ventilation: Mask ventilation without difficulty Laryngoscope Size: Glidescope and 4 Grade View: Grade I Tube type: Oral Number of attempts: 1 Airway Equipment and Method: Stylet and Oral airway Placement Confirmation: ETT inserted through vocal cords under direct vision,  positive ETCO2 and breath sounds checked- equal and bilateral Secured at: 23 cm Tube secured with: Tape Dental Injury: Teeth and Oropharynx as per pre-operative assessment  Difficulty Due To: Difficulty was anticipated

## 2016-08-12 NOTE — Brief Op Note (Signed)
08/12/2016  2:08 PM  PATIENT:  Karna DupesGulzar Gilkes  70 y.o. male  PRE-OPERATIVE DIAGNOSIS:   1. LEFT TIBIAL FRACTURE NONUNION 2. RETAINED ANTIBIOTIC NAIL  POST-OPERATIVE DIAGNOSIS:   1. LEFT TIBIAL FRACTURE NONUNION 2. RETAINED ANTIBIOTIC NAIL  PROCEDURE:  Procedure(s): 1. INTRAMEDULLARY (IM) NAILING TIBIA WITH BIOMET VERSANAIL 10 X 345MM STATICALLY LOCKED AND REMOVAL ANTIBOTIC NAIL (Left) 2. REPAIR OF TIBIAL NONUNION, LEFT 3. STRESS FLOUROSCOPY TIBIAL NONUNION SITE  SURGEON:  Surgeon(s) and Role:    * Myrene GalasMichael Nelda Luckey, MD - Primary  PHYSICIAN ASSISTANT: Montez MoritaKEITH PAUL, PAC  ANESTHESIA:   general  EBL:  Total I/O In: 1000 [I.V.:1000] Out: 50 [Blood:50]  BLOOD ADMINISTERED:none  DRAINS: none   LOCAL MEDICATIONS USED:  NONE  SPECIMEN:  Source of Specimen:  REAMINGS  DISPOSITION OF SPECIMEN:  MICRO  COUNTS:  YES  TOURNIQUET:  * No tourniquets in log *  DICTATION: .Other Dictation: Dictation Number [id number]  PLAN OF CARE: Discharge to home after PACU  PATIENT DISPOSITION:  PACU - hemodynamically stable.   Delay start of Pharmacological VTE agent (>24hrs) due to surgical blood loss or risk of bleeding: no

## 2016-08-12 NOTE — Anesthesia Postprocedure Evaluation (Signed)
Anesthesia Post Note  Patient: [name]  Procedure(s) Performed: Procedure(s) (LRB): INTRAMEDULLARY (IM) NAIL TIBIAL/REMOVAL ANTIBOTIC NAIL (Left)  Patient location during evaluation: PACU Anesthesia Type: General Level of consciousness: awake and alert Pain management: pain level controlled Vital Signs Assessment: post-procedure vital signs reviewed and stable Respiratory status: spontaneous breathing, nonlabored ventilation, respiratory function stable and patient connected to nasal cannula oxygen Cardiovascular status: blood pressure returned to baseline and stable Postop Assessment: no signs of nausea or vomiting Anesthetic complications: no    Last Vitals:  Vitals:   08/12/16 1530 08/12/16 1548  BP:  (!) 141/83  Pulse: 72 75  Resp: 12 12  Temp:      Last Pain:  Vitals:   08/12/16 1548  TempSrc:   PainSc: 0-No pain    LLE Motor Response: Purposeful movement;Responds to commands (08/12/16 1548) LLE Sensation: Full sensation (08/12/16 1548)          Kennieth RadFitzgerald, Ruston Fedora E

## 2016-08-12 NOTE — Progress Notes (Signed)
Attempted IV x 2 in right and left hand without success. Larita FifeLynn, CRNA called for assistance.

## 2016-08-13 ENCOUNTER — Encounter (HOSPITAL_COMMUNITY): Payer: Self-pay | Admitting: Orthopedic Surgery

## 2016-08-13 NOTE — Op Note (Signed)
NAMJosem Kaufmann:  Poster, Yukio                ACCOUNT NO.:  [id number]  MEDICAL RECORD NO.:  [id number]  LOCATION:  [facility]                         FACILITY:  [facility]  PHYSICIAN:  Doralee AlbinoMichael H. Carola FrostHandy, M.D. DATE OF BIRTH:  25-Feb-1946  DATE OF PROCEDURE:  08/12/2016 DATE OF DISCHARGE:                              OPERATIVE REPORT   PREOPERATIVE DIAGNOSES: 1. Left tibia nonunion. 2. Retained antibiotic nail, left tibia.  POSTOPERATIVE DIAGNOSES: 1. Left tibia nonunion. 2. Retained antibiotic nail, left tibia.  PROCEDURES: 1. Repair of left tibia nonunion with autografting. 2. Intramedullary nailing of the left tibia and removal of antibiotic     rod. 3. Stress fluoroscopy of the left tibia.  SURGEON:  Doralee AlbinoMichael H. Carola FrostHandy, M.D.  ASSISTANT:  Montez MoritaKeith Paul, PA-C.  ANESTHESIA:  General.  COMPLICATIONS:  None.  TOURNIQUET:  None.  SPECIMENS:  Two reamings.  DISPOSITION:  To PACU.  CONDITION:  Stable.  BRIEF SUMMARY AND INDICATION FOR PROCEDURE:  [name] is a pleasant 70 year old male who sustained a nonunion, infected, treated with removal of hardware and placement of an antibiotic rod approximately 8 weeks ago.  I discussed with the patient the risks and benefits of returning to the OR for repair of his tibia nonunion with autografting. We also discussed possible stabilization with an intramedullary nail as well, which we would avoid if the patient did not have any potential instability as I did not wish to leave the implant and if he may come to total knee arthroplasty.  We discussed risks included persistent infection, nerve injury, vessel injury, DVT, heart attack, stroke, need for further surgery, symptomatic hardware, and multiple others. The patient did wish to proceed.  BRIEF SUMMARY OF PROCEDURE:  The patient was taken to the operating room.  Antibiotics were given as the patient had no clinical evidence of infection.  The left lower extremity was prepped and draped in  usual sterile fashion after a time-out.  The anterior knee incision was re- made and dissection carried down distally.  I had to extend the incision distally to identify and remove the rod as the initial insertion site was more distal than required for a simple intramedullary nailing. After identification and removal, a guidewire was placed and then the cavity reamed to generate autograft.  I brought in the C-arm and was concerned about the ability to produce some angulation under stress. Consequently, I chose to proceed with placement of an intramedullary nail for additional stabilization rather than just autografting internally.  I did ream to a 12-mm space and then attempted to place an 11-mm nail.  I did not wish to go larger with the reaming as we were getting good cortical contact and not wish to devascularize the bone excessively.  I was unable to seat the 11-mm nail and were close to getting incarcerated.  It did however produce some improvement and alignment shifting the leg into some valgus and proper orientation.  I withdrew this and placed a 10-mm nail.  The nail was rotated to assist to restore appropriate valgus alignment.  A single lock was placed proximally and a single lock distally.  The wounds were irrigated and then closed in standard layered fashion.  There were no complications. The patient was taken to the PACU in stable condition.  PROGNOSIS:  He will be weightbearing as tolerated with unrestricted range of motion of the knee and ankle.  We will anticipate seeing him back for removal of sutures in 10-14 days.  He should have a high chance of success with this procedure.     Doralee AlbinoMichael H. Carola FrostHandy, M.D.     [facility]MED[facility]  D:  08/13/2016  T:  08/13/2016  Job:  [id number]

## 2016-08-17 LAB — AEROBIC/ANAEROBIC CULTURE (SURGICAL/DEEP WOUND): CULTURE: NO GROWTH

## 2016-08-17 LAB — AEROBIC/ANAEROBIC CULTURE W GRAM STAIN (SURGICAL/DEEP WOUND)

## 2016-08-19 ENCOUNTER — Telehealth: Payer: Self-pay | Admitting: Cardiology

## 2016-08-19 ENCOUNTER — Encounter: Payer: Medicare Other | Admitting: *Deleted

## 2016-08-19 NOTE — Telephone Encounter (Signed)
Attempted to confirm remote transmission with pt. No answer and was unable to leave a message.   

## 2016-08-22 ENCOUNTER — Encounter: Payer: Self-pay | Admitting: Cardiology

## 2016-12-04 ENCOUNTER — Encounter: Payer: Self-pay | Admitting: Cardiology

## 2016-12-05 ENCOUNTER — Telehealth: Payer: Self-pay

## 2016-12-05 NOTE — Telephone Encounter (Signed)
Called to schedule patient in the device clinic and distribute new home monitor. Patient's son stated that he was out of the country and will return in one month. He stated that they will call and schedule an appointment at that time

## 2017-02-05 ENCOUNTER — Telehealth: Payer: Self-pay

## 2017-02-05 NOTE — Telephone Encounter (Signed)
Spoke with pts son, he stated that pt is out of the country until June and pt would call to set up appointment.

## 2017-04-19 IMAGING — DX DG TIBIA/FIBULA PORT 2V*L*
4 series · 4 of 4 positions shown · non-contrast
Comparison: Radiographs June 19, 2016.

CLINICAL DATA: Tibia and fibula open fracture, right, with
nonunion, subsequent encounter.

EXAM:
PORTABLE LEFT TIBIA AND FIBULA - 2 VIEW

[tibia ap (1 of 2)]
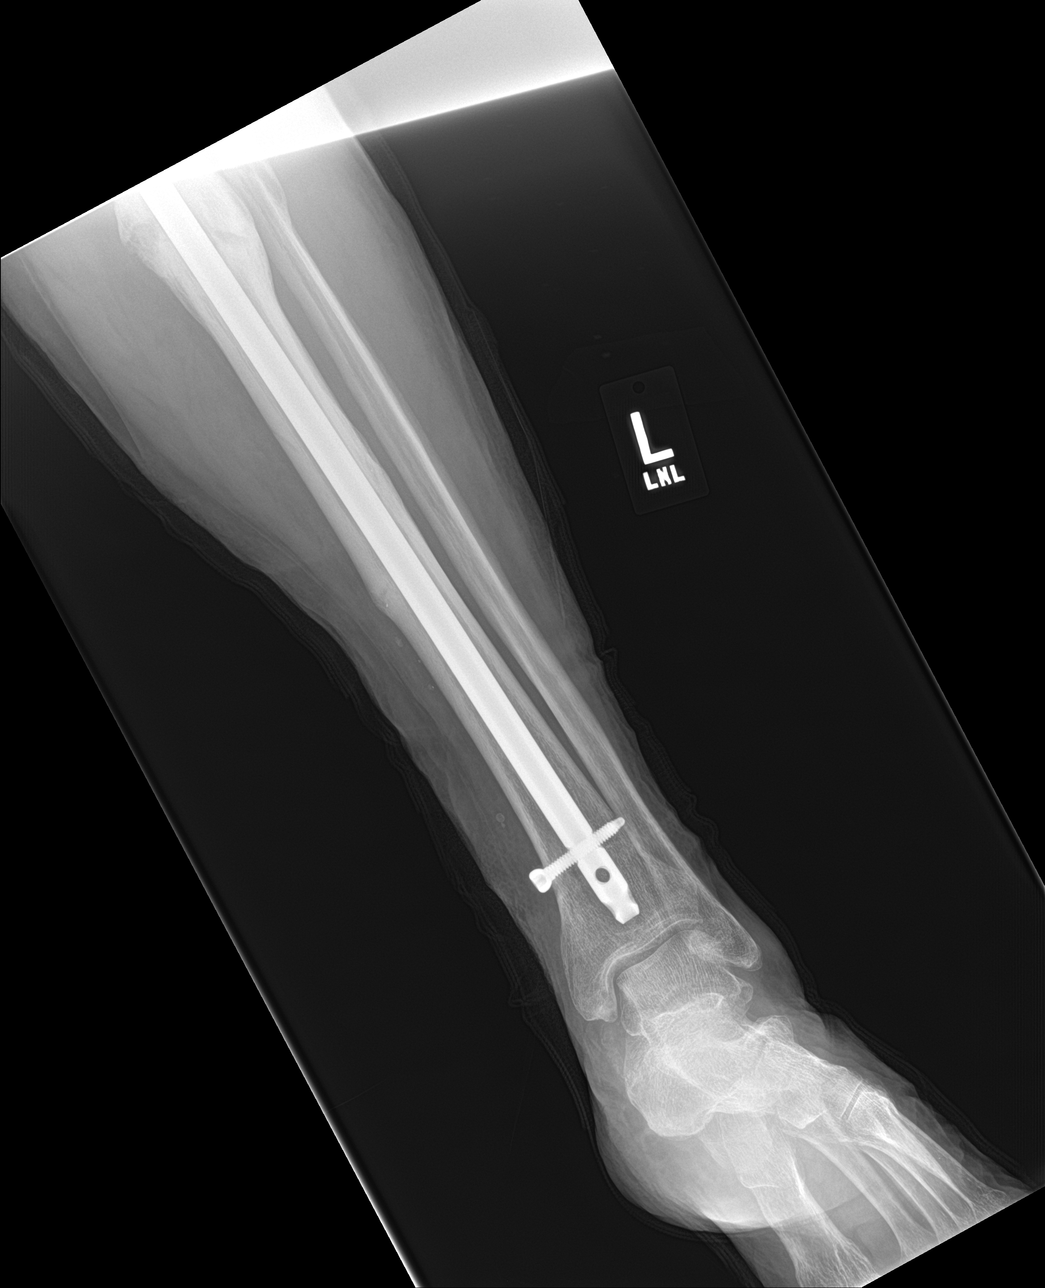

[tibia ap (2 of 2)]
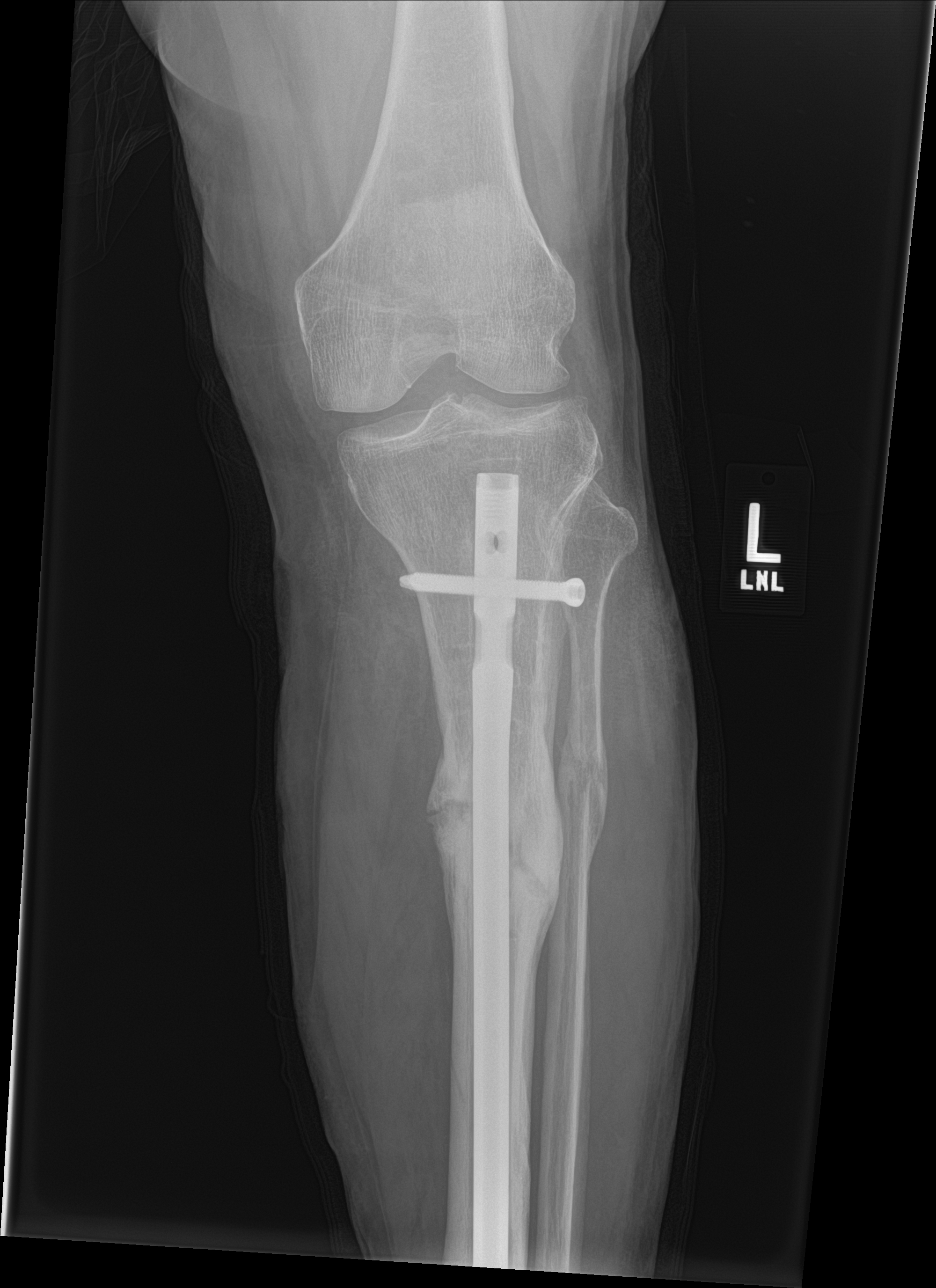

[tibia obl (1 of 2)]
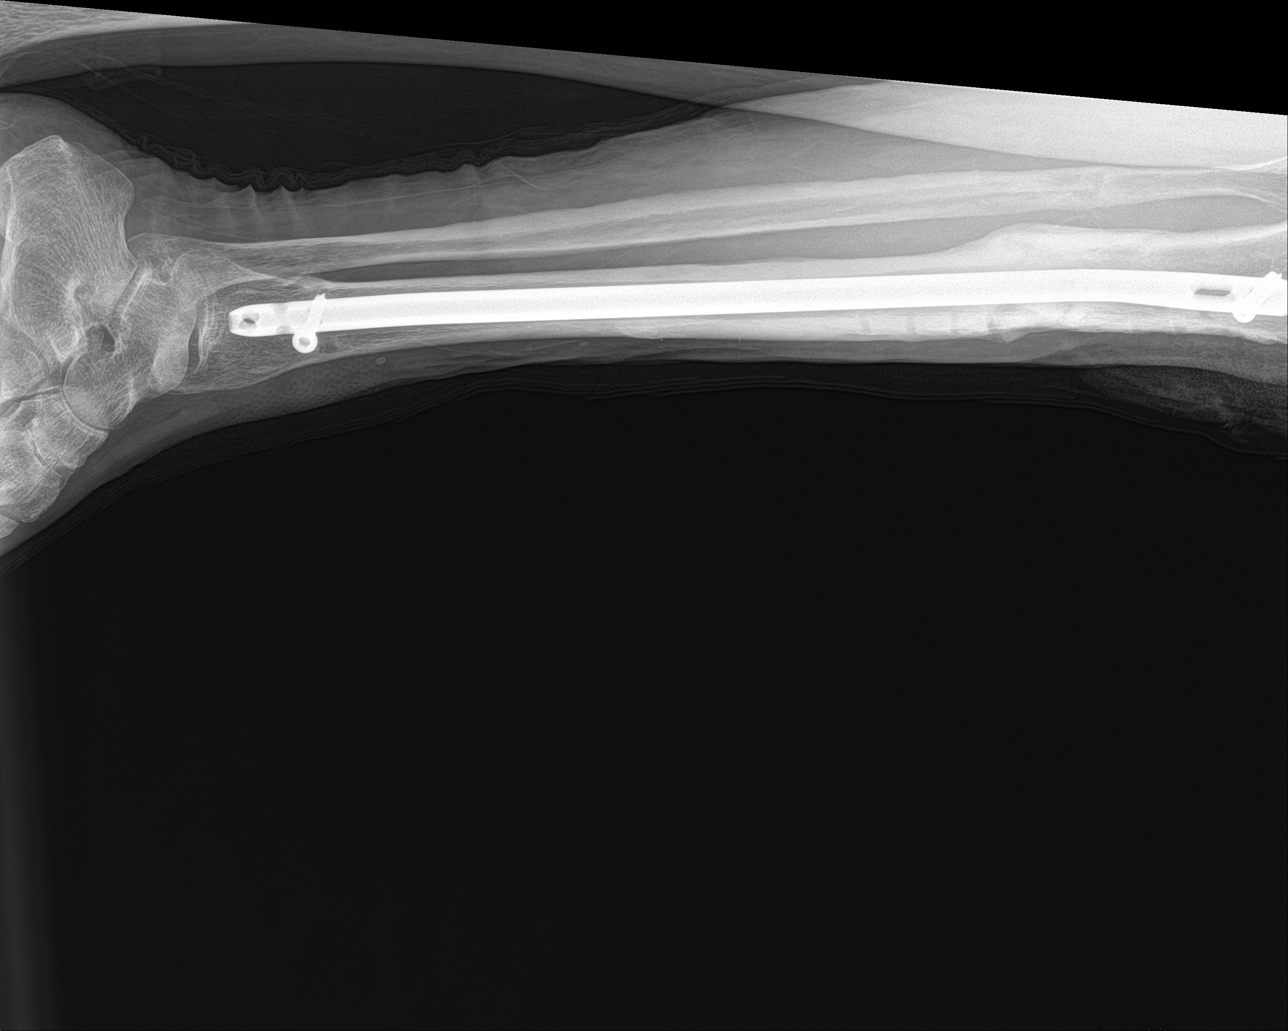

[tibia obl (2 of 2)]
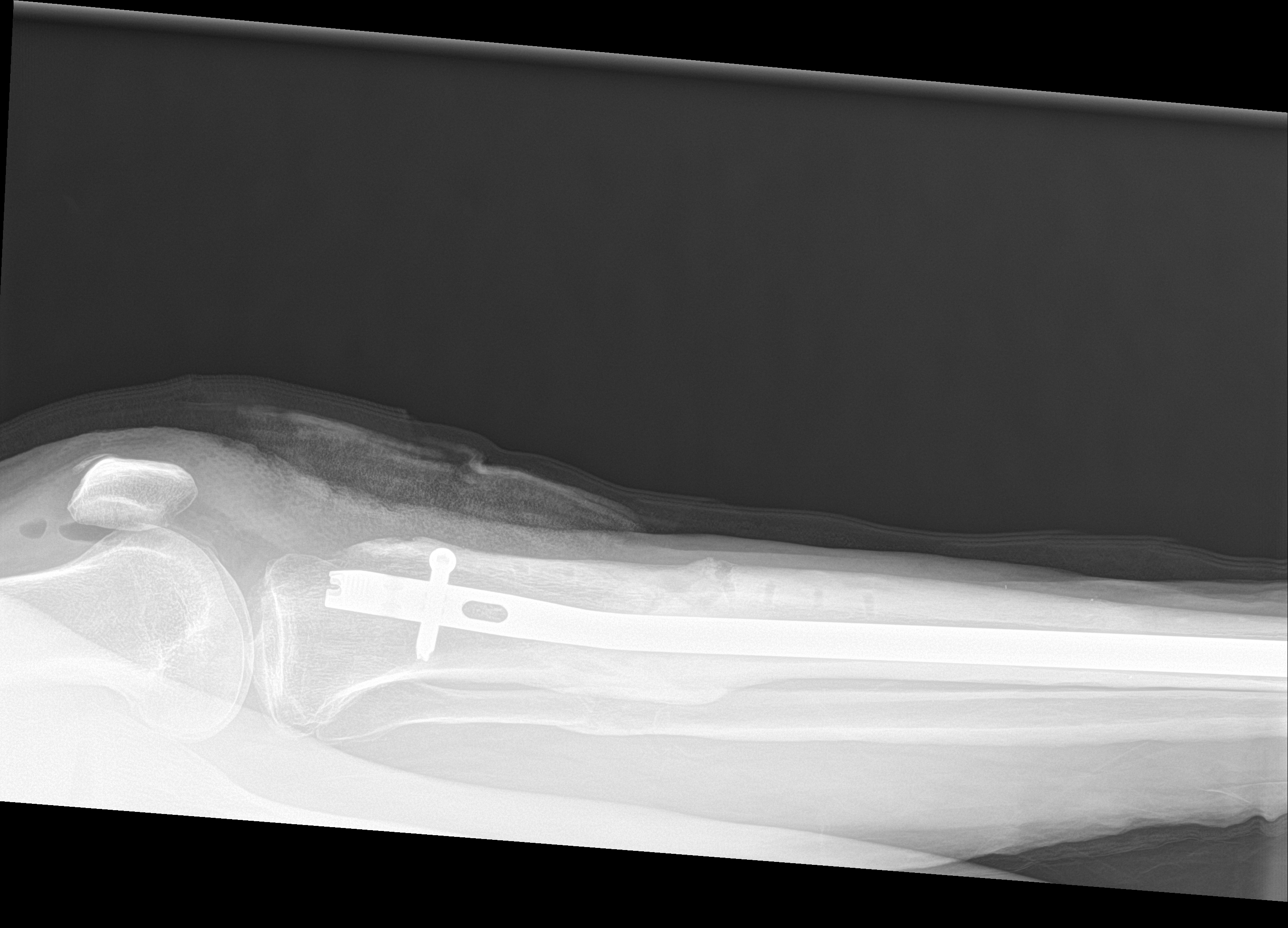

[4 of 4 positions shown; findings below may reference images not displayed]

FINDINGS: Status post intra medullary rod fixation of old proximal left tibial
fracture with extensive callus formation. Old healed proximal
fibular fracture is noted as well. No acute fracture or dislocation
is noted.
IMPRESSION: Postsurgical and old traumatic findings as described above.

## 2017-10-21 ENCOUNTER — Encounter: Payer: Self-pay | Admitting: Internal Medicine

## 2017-10-21 ENCOUNTER — Ambulatory Visit (INDEPENDENT_AMBULATORY_CARE_PROVIDER_SITE_OTHER): Payer: Medicare Other | Admitting: Internal Medicine

## 2017-10-21 VITALS — BP 142/88 | HR 95 | Ht 65.0 in | Wt 172.2 lb

## 2017-10-21 DIAGNOSIS — I5022 Chronic systolic (congestive) heart failure: Secondary | ICD-10-CM | POA: Diagnosis not present

## 2017-10-21 DIAGNOSIS — I429 Cardiomyopathy, unspecified: Secondary | ICD-10-CM | POA: Diagnosis not present

## 2017-10-21 DIAGNOSIS — Z95810 Presence of automatic (implantable) cardiac defibrillator: Secondary | ICD-10-CM

## 2017-10-21 DIAGNOSIS — I472 Ventricular tachycardia, unspecified: Secondary | ICD-10-CM

## 2017-10-21 NOTE — Progress Notes (Signed)
HPI [name] returns today for ongoing evaluation of an ICM, chronic class 2 CHF, and VT, s/p ICD insertion. In the interim he has done well with no ICD shocks. He denies chest pain or sob. No syncope. No edema. [place] is going [place].  No Known Allergies   Current Outpatient Medications  Medication Sig Dispense Refill  . acetaminophen (TYLENOL) 500 MG tablet Take 1-2 tablets (500-1,000 mg total) by mouth every 6 (six) hours as needed for mild pain or moderate pain. 90 tablet 0  . amiodarone (PACERONE) 200 MG tablet Take 1 tablet (200 mg total) by mouth daily. 90 tablet 3  . atorvastatin (LIPITOR) 40 MG tablet Take 40 mg by mouth daily.  3  . BYSTOLIC 10 MG tablet Take 10 mg by mouth daily.  3  . glimepiride (AMARYL) 4 MG tablet Take 4 mg by mouth daily with breakfast.    . metFORMIN (GLUCOPHAGE) 850 MG tablet Take 850 mg by mouth 2 (two) times daily.      Marland Kitchen. oxycodone (ROXICODONE) 30 MG immediate release tablet Take 30 mg by mouth 3 (three) times daily as needed for pain.   0  . Oxycodone HCl 10 MG TABS Take 1 tablet (10 mg total) by mouth every 8 (eight) hours as needed. 25 tablet 0  . ramipril (ALTACE) 5 MG capsule Take 1 capsule (5 mg total) by mouth daily. 90 capsule 1   No current facility-administered medications for this visit.      Past Medical History:  Diagnosis Date  . CAD (coronary artery disease)    unspec site  . Cardiomyopathy, ischemic   . Congestive heart failure (HCC)    unspecified  . Diabetes mellitus without complication (HCC)   . Essential hypertension 02/03/2009   Qualifier: Diagnosis of  By: Bascom LevelsFrazier, RMA, Sherri    . HLD (hyperlipidemia)    mixed  . HTN (hypertension)    unspecified  . Ischemic cardiomyopathy 06/23/2016  . Sick sinus syndrome (HCC)    tachy-brday syndrome  . Tibia and fibula open fracture, right, type I or II, with nonunion, subsequent encounter 06/23/2016    ROS:   All systems reviewed and  negative except as noted in the HPI.   Past Surgical History:  Procedure Laterality Date  . APPENDECTOMY    . CATARACT EXTRACTION    . FRACTURE SURGERY     left leg  . HARDWARE REMOVAL Left 06/19/2016   Procedure: HARDWARE REMOVAL LEFT TIBIAL;  Surgeon: Myrene GalasMichael Handy, MD;  [facility];  Service: Orthopedics;  Laterality: Left;  . HERNIA REPAIR    . icd  5/07   for cardiomyopathy- St Jude Atlas 2 VR  . IMPLANTABLE CARDIOVERTER DEFIBRILLATOR (ICD) GENERATOR CHANGE N/A 07/18/2013   Procedure: ICD GENERATOR CHANGE;  Surgeon: Marinus MawGregg W Diamante Rubin, MD;  [facility];  Service: Cardiovascular;  Laterality: N/A;  . INTRAMEDULLARY (IM) CEMENTED ANTIOBIOTIC NAIL Left 06/19/2016   Procedure: INTRAMEDULLARY (IM) NAIL TIBIAL ANTIBIOTIC NAIL;  Surgeon: Myrene GalasMichael Handy, MD;  Location: [facility] OR;  Service: Orthopedics;  Laterality: Left;  . TIBIA IM NAIL INSERTION Left 08/12/2016   Procedure: INTRAMEDULLARY (IM) NAIL TIBIAL/REMOVAL ANTIBOTIC NAIL;  Surgeon: Myrene GalasMichael Handy, MD;  Location: [facility] OR;  Service: Orthopedics;  Laterality: Left;     History reviewed. No pertinent family history.   Social History   Socioeconomic History  . Marital status: Married    Spouse name: Not on file  . Number  of children: Not on file  . Years of education: Not on file  . Highest education level: Not on file  Social Needs  . Financial resource strain: Not on file  . Food insecurity - worry: Not on file  . Food insecurity - inability: Not on file  . Transportation needs - medical: Not on file  . Transportation needs - non-medical: Not on file  Occupational History  . Not on file  Tobacco Use  . Smoking status: Former Smoker    Types: Cigarettes    Last attempt to quit: 04/29/2003    Years since quitting: 14.4  . Smokeless tobacco: Never Used  . Tobacco comment: tobacco use- no   Substance and Sexual Activity  . Alcohol use: No  . Drug use: No  . Sexual activity: Not on file  Other Topics Concern  . Not  on file  Social History Narrative  . Not on file     BP (!) 142/88   Pulse 95   Ht 5\' 5"  (1.651 m)   Wt 172 lb 3.2 oz (78.1 kg)   BMI 28.66 kg/m   Physical Exam:  Well appearing 72 yo man, NAD HEENT: Unremarkable Neck:  6 cm JVD, no thyromegally Lymphatics:  No adenopathy Back:  No CVA tenderness Lungs:  Clear with no wheezes HEART:  Regular rate rhythm, no murmurs, no rubs, no clicks Abd:  soft, positive bowel sounds, no organomegally, no rebound, no guarding Ext:  2 plus pulses, no edema, no cyanosis, no clubbing Skin:  No rashes no nodules Neuro:  CN II through XII intact, motor grossly intact  EKG - none  DEVICE  Normal device function.  See PaceArt for details.   Assess/Plan: 1. VT - his symptoms are well controlled. He will continue low dose amiodarone. 2. Chronic systolic heart failure - his symptoms are class 2. He will continue his current meds.  3. CAD - he denies anginal symptoms. No change in meds.  4. ICD - his St. Jude single chamber ICD is working normally with over 5 years of battery longevity. He has had no sustained VT.  [name]

## 2017-10-21 NOTE — Patient Instructions (Signed)

## 2017-10-22 LAB — CUP PACEART INCLINIC DEVICE CHECK
Implantable Lead Location: 753860
Implantable Pulse Generator Implant Date: 20141027
MDC IDC LEAD IMPLANT DT: 20070501
MDC IDC SESS DTM: 20190131150529
Pulse Gen Serial Number: 7138508

## 2018-01-20 ENCOUNTER — Telehealth: Payer: Self-pay | Admitting: Cardiology

## 2018-01-20 ENCOUNTER — Encounter: Payer: Medicare Other | Admitting: *Deleted

## 2018-01-20 NOTE — Telephone Encounter (Signed)
Spoke with pt and reminded pt of remote transmission that is due today. Pt verbalized understanding.   

## 2018-01-21 ENCOUNTER — Encounter: Payer: Self-pay | Admitting: Cardiology

## 2018-03-03 ENCOUNTER — Encounter: Payer: Self-pay | Admitting: Cardiology

## 2018-05-26 ENCOUNTER — Ambulatory Visit (INDEPENDENT_AMBULATORY_CARE_PROVIDER_SITE_OTHER): Payer: Medicare Other | Admitting: *Deleted

## 2018-05-26 DIAGNOSIS — I5022 Chronic systolic (congestive) heart failure: Secondary | ICD-10-CM

## 2018-05-26 DIAGNOSIS — I429 Cardiomyopathy, unspecified: Secondary | ICD-10-CM | POA: Diagnosis not present

## 2018-05-26 NOTE — Progress Notes (Signed)
Remote ICD transmission.   

## 2018-06-22 LAB — CUP PACEART REMOTE DEVICE CHECK
Brady Statistic RV Percent Paced: 0 %
HighPow Impedance: 69 Ohm
HighPow Impedance: 69 Ohm
Implantable Lead Implant Date: 20070501
Implantable Lead Location: 753860
Implantable Lead Model: 7000
Lead Channel Pacing Threshold Amplitude: 1 V
Lead Channel Pacing Threshold Pulse Width: 0.7 ms
Lead Channel Setting Pacing Amplitude: 2.5 V
Lead Channel Setting Pacing Pulse Width: 0.7 ms
Lead Channel Setting Sensing Sensitivity: 0.5 mV
MDC IDC MSMT BATTERY REMAINING LONGEVITY: 61 mo
MDC IDC MSMT BATTERY REMAINING PERCENTAGE: 61 %
MDC IDC MSMT BATTERY VOLTAGE: 2.93 V
MDC IDC MSMT LEADCHNL RV IMPEDANCE VALUE: 510 Ohm
MDC IDC MSMT LEADCHNL RV SENSING INTR AMPL: 5.7 mV
MDC IDC PG IMPLANT DT: 20141027
MDC IDC SESS DTM: 20190904150302
Pulse Gen Serial Number: 7138508

## 2018-08-25 ENCOUNTER — Telehealth: Payer: Self-pay

## 2018-08-25 NOTE — Telephone Encounter (Signed)
I called the pt to remind him to send a home remote transmission but whomever answered the phone hung up.

## 2018-08-26 ENCOUNTER — Encounter: Payer: Self-pay | Admitting: Cardiology

## 2019-07-12 ENCOUNTER — Other Ambulatory Visit: Payer: Self-pay

## 2019-07-12 ENCOUNTER — Encounter: Payer: Self-pay | Admitting: Internal Medicine

## 2019-07-12 ENCOUNTER — Ambulatory Visit (INDEPENDENT_AMBULATORY_CARE_PROVIDER_SITE_OTHER): Payer: Medicare Other | Admitting: Internal Medicine

## 2019-07-12 VITALS — BP 162/90 | HR 81 | Ht 65.0 in | Wt 158.0 lb

## 2019-07-12 DIAGNOSIS — I255 Ischemic cardiomyopathy: Secondary | ICD-10-CM | POA: Diagnosis not present

## 2019-07-12 DIAGNOSIS — I472 Ventricular tachycardia, unspecified: Secondary | ICD-10-CM

## 2019-07-12 DIAGNOSIS — Z95810 Presence of automatic (implantable) cardiac defibrillator: Secondary | ICD-10-CM

## 2019-07-12 DIAGNOSIS — I5022 Chronic systolic (congestive) heart failure: Secondary | ICD-10-CM

## 2019-07-12 NOTE — Patient Instructions (Signed)
Medication Instructions:  Your physician recommends that you continue on your current medications as directed. Please refer to the Current Medication list given to you today.  Labwork: None ordered.  Testing/Procedures: None ordered.  Follow-Up: Your physician wants you to follow-up in: one year with Dr. Lovena Le.   You will receive a reminder letter in the mail two months in advance. If you don't receive a letter, please call our office to schedule the follow-up appointment.  Remote monitoring is used to monitor your ICD from home. This monitoring reduces the number of office visits required to check your device to one time per year. It allows [place] to keep an eye on the functioning of your device to ensure it is working properly. You are scheduled for a device check from home on 10/11/2019. You may send your transmission at any time that day. If you have a wireless device, the transmission will be sent automatically. After your physician reviews your transmission, you will receive a postcard with your next transmission date.  Any Other Special Instructions Will Be Listed Below (If Applicable).  If you need a refill on your cardiac medications before your next appointment, please call your pharmacy.

## 2019-07-12 NOTE — Progress Notes (Signed)
HPI Mr. Dilauro returns today for ongoing evaluation of an ICM, chronic class 2 CHF, and VT, s/p ICD insertion. In the interim he has done well with no ICD shocks. He denies chest pain or sob. No syncope. No edema. He is going back and forth between [place] and [employer]. His blood pressure at home is usually controlled.  No Known Allergies   Current Outpatient Medications  Medication Sig Dispense Refill  . acetaminophen (TYLENOL) 500 MG tablet Take 1-2 tablets (500-1,000 mg total) by mouth every 6 (six) hours as needed for mild pain or moderate pain. 90 tablet 0  . atorvastatin (LIPITOR) 40 MG tablet Take 40 mg by mouth daily.  3  . carvedilol (COREG) 12.5 MG tablet Take 1 tablet by mouth daily.    Marland Kitchen gabapentin (NEURONTIN) 300 MG capsule Take 1 capsule by mouth daily.    Marland Kitchen glimepiride (AMARYL) 4 MG tablet Take 4 mg by mouth daily with breakfast.    . metFORMIN (GLUCOPHAGE) 850 MG tablet Take 850 mg by mouth 2 (two) times daily.       No current facility-administered medications for this visit.      Past Medical History:  Diagnosis Date  . CAD (coronary artery disease)    unspec site  . Cardiomyopathy, ischemic   . Congestive heart failure (HCC)    unspecified  . Diabetes mellitus without complication (HCC)   . Essential hypertension 02/03/2009   Qualifier: Diagnosis of  By: Bascom Levels, RMA, Sherri    . HLD (hyperlipidemia)    mixed  . HTN (hypertension)    unspecified  . Ischemic cardiomyopathy 06/23/2016  . Sick sinus syndrome (HCC)    tachy-brday syndrome  . Tibia and fibula open fracture, right, type I or II, with nonunion, subsequent encounter 06/23/2016    ROS:   All systems reviewed and negative except as noted in the HPI.   Past Surgical History:  Procedure Laterality Date  . APPENDECTOMY    . CATARACT EXTRACTION    . FRACTURE SURGERY     left leg  . HARDWARE REMOVAL Left 06/19/2016   Procedure: HARDWARE REMOVAL LEFT TIBIAL;  Surgeon: Myrene Galas, MD;   Location: [facility] OR;  Service: Orthopedics;  Laterality: Left;  . HERNIA REPAIR    . icd  5/07   for cardiomyopathy- St Jude Atlas 2 VR  . IMPLANTABLE CARDIOVERTER DEFIBRILLATOR (ICD) GENERATOR CHANGE N/A 07/18/2013   Procedure: ICD GENERATOR CHANGE;  Surgeon: Marinus Maw, MD;  Location: [facility] C[facility] LAB;  Service: Cardiovascular;  Laterality: N/A;  . INTRAMEDULLARY (IM) CEMENTED ANTIOBIOTIC NAIL Left 06/19/2016   Procedure: INTRAMEDULLARY (IM) NAIL TIBIAL ANTIBIOTIC NAIL;  Surgeon: Myrene Galas, MD;  Location: [facility] OR;  Service: Orthopedics;  Laterality: Left;  . TIBIA IM NAIL INSERTION Left 08/12/2016   Procedure: INTRAMEDULLARY (IM) NAIL TIBIAL/REMOVAL ANTIBOTIC NAIL;  Surgeon: Myrene Galas, MD;  Location: [facility] OR;  Service: Orthopedics;  Laterality: Left;     No family history on file.   Social History   Socioeconomic History  . Marital status: Married    Spouse name: Not on file  . Number of children: Not on file  . Years of education: Not on file  . Highest education level: Not on file  Occupational History  . Not on file  Social Needs  . Financial resource strain: Not on file  . Food insecurity    Worry: Not on file    Inability: Not on file  . Transportation needs  Medical: Not on file    Non-medical: Not on file  Tobacco Use  . Smoking status: Former Smoker    Types: Cigarettes    Quit date: 04/29/2003    Years since quitting: 16.2  . Smokeless tobacco: Never Used  . Tobacco comment: tobacco use- no   Substance and Sexual Activity  . Alcohol use: No  . Drug use: No  . Sexual activity: Not on file  Lifestyle  . Physical activity    Days per week: Not on file    Minutes per session: Not on file  . Stress: Not on file  Relationships  . Social Herbalist on phone: Not on file    Gets together: Not on file    Attends religious service: Not on file    Active member of club or organization: Not on file    Attends meetings of clubs or organizations: Not on  file    Relationship status: Not on file  . Intimate partner violence    Fear of current or ex partner: Not on file    Emotionally abused: Not on file    Physically abused: Not on file    Forced sexual activity: Not on file  Other Topics Concern  . Not on file  Social History Narrative  . Not on file     BP (!) 162/90   Pulse 81   Ht 5\' 5"  (1.651 m)   Wt 158 lb (71.7 kg)   SpO2 96%   BMI 26.29 kg/m   Physical Exam:  Well appearing 73 yo man, NAD HEENT: Unremarkable Neck:  No JVD, no thyromegally Lymphatics:  No adenopathy Back:  No CVA tenderness Lungs:  Clear with no wheezes HEART:  Regular rate rhythm, no murmurs, no rubs, no clicks Abd:  soft, positive bowel sounds, no organomegally, no rebound, no guarding Ext:  2 plus pulses, no edema, no cyanosis, no clubbing Skin:  No rashes no nodules Neuro:  CN II through XII intact, motor grossly intact  EKG - NSR with AS MI  DEVICE  Normal device function.  See PaceArt for details.   Assess/Plan: 1. VT - he has had no recurrent episodes of VT in the past year. He will continue his current meds 2. Ischemic CM - he is s/p remote MI and has no anginal symptoms or ECG changes. He will undergo watchful waiting. 3. ICD - his St. Jude single chamber ICD is working normally. 4. Dyslipidemia - no muscle pains. He will continue atorvastatin. No muscle aches.  Mikle Bosworth.D.

## 2021-01-07 ENCOUNTER — Encounter: Payer: Medicare Other | Admitting: Physician Assistant

## 2021-01-07 NOTE — Progress Notes (Deleted)
Cardiology Office Note Date:  01/07/2021  Patient ID:  [name] 12-13-1945, MRN [id number] PCP:  Treasa School, PA-C  Electrophysiologist: Dr. Ladona Ridgel  ***refresh   Chief Complaint: *** over due annual viist  History of Present Illness: [name] is a 75 y.o. male with history of VT w/ICD, ICM, CAD (very remote out of hospital MI), CHF, HTN, HLD, tachy-brady syndrome.  Has irregular follow up, some years without visits  He comes in today to be seen for dr. Ladona Ridgel, last seen by him Oct 2020, doing well, no changes were made.  *** symptoms *** meds, CM *** VT *** labs, lipids.... *** Fam hx *** amiodarone???? When/why stopped    Device information:  SJM single chamber ICD, gen change 07/18/16 Dr. Ladona Ridgel, original device 01/20/06 + history of appropriate shocks for VT on a number of occassions over the years, going back [id number]  AAD Hx amiodarone chronically 2015 >>> ***  stopped 2020   Past Medical History:  Diagnosis Date  . CAD (coronary artery disease)    unspec site  . Cardiomyopathy, ischemic   . Congestive heart failure (HCC)    unspecified  . Diabetes mellitus without complication (HCC)   . Essential hypertension 02/03/2009   Qualifier: Diagnosis of  By: Bascom Levels, RMA, Sherri    . HLD (hyperlipidemia)    mixed  . HTN (hypertension)    unspecified  . Ischemic cardiomyopathy 06/23/2016  . Sick sinus syndrome (HCC)    tachy-brday syndrome  . Tibia and fibula open fracture, right, type I or II, with nonunion, subsequent encounter 06/23/2016    Past Surgical History:  Procedure Laterality Date  . APPENDECTOMY    . CATARACT EXTRACTION    . FRACTURE SURGERY     left leg  . HARDWARE REMOVAL Left 06/19/2016   Procedure: HARDWARE REMOVAL LEFT TIBIAL;  Surgeon: Myrene Galas, MD;  Location: [facility] OR;  Service: Orthopedics;  Laterality: Left;  . HERNIA REPAIR    . icd  5/07   for cardiomyopathy- St Jude Atlas 2 VR  . IMPLANTABLE CARDIOVERTER DEFIBRILLATOR  (ICD) GENERATOR CHANGE N/A 07/18/2013   Procedure: ICD GENERATOR CHANGE;  Surgeon: Marinus Maw, MD;  Location[facility] CATH LAB;  Service: Cardiovascular;  Laterality: N/A;  . INTRAMEDULLARY (IM) CEMENTED ANTIOBIOTIC NAIL Left 06/19/2016   Procedure: INTRAMEDULLARY (IM) NAIL TIBIAL ANTIBIOTIC NAIL;  Surgeon: Myrene Galas, MD;  Location: [facility] OR;  Service: Orthopedics;  Laterality: Left;  . TIBIA IM NAIL INSERTION Left 08/12/2016   Procedure: INTRAMEDULLARY (IM) NAIL TIBIAL/REMOVAL ANTIBOTIC NAIL;  Surgeon: Myrene Galas, MD;  Location: [facility] OR;  Service: Orthopedics;  Laterality: Left;    Current Outpatient Medications  Medication Sig Dispense Refill  . acetaminophen (TYLENOL) 500 MG tablet Take 1-2 tablets (500-1,000 mg total) by mouth every 6 (six) hours as needed for mild pain or moderate pain. 90 tablet 0  . atorvastatin (LIPITOR) 40 MG tablet Take 40 mg by mouth daily.  3  . carvedilol (COREG) 12.5 MG tablet Take 1 tablet by mouth daily.    Marland Kitchen gabapentin (NEURONTIN) 300 MG capsule Take 1 capsule by mouth daily.    Marland Kitchen glimepiride (AMARYL) 4 MG tablet Take 4 mg by mouth daily with breakfast.    . metFORMIN (GLUCOPHAGE) 850 MG tablet Take 850 mg by mouth 2 (two) times daily.       No current facility-administered medications for this visit.    Allergies:   Patient has no known allergies.   Social History:  The patient  reports that he quit smoking about 17 years ago. His smoking use included cigarettes. He has never used smokeless tobacco. He reports that he does not drink alcohol and does not use drugs.   Family History:  The patient's family history is not on file.***  ROS:  Please see the history of present illness.    All other systems are reviewed and otherwise negative.   PHYSICAL EXAM:  VS:  There were no vitals taken for this visit. BMI: There is no height or weight on file to calculate BMI. Well nourished, well developed, in no acute distress HEENT: normocephalic, atraumatic Neck:  no JVD, carotid bruits or masses Cardiac:  *** RRR; no significant murmurs, no rubs, or gallops Lungs:  *** CTA b/l, no wheezing, rhonchi or rales Abd: soft, nontender MS: no deformity or *** atrophy Ext: *** no edema Skin: warm and dry, no rash Neuro:  No gross deficits appreciated Psych: euthymic mood, full affect  *** ICD site is stable, no tethering or discomfort   EKG:  Done today and reviewed by myself shows  ***  Device interrogation done today and reviewed by myself:  ***  05/20/2016: TTE Study Conclusions  - Left ventricle: The cavity size was normal. Wall thickness was  normal. Systolic function was moderately to severely reduced. The  estimated ejection fraction was in the range of 30% to 35%.  Diffuse hypokinesis. There is akinesis of the  mid-apicalanteroseptal and apical myocardium. Doppler parameters  are consistent with abnormal left ventricular relaxation (grade 1  diastolic dysfunction).   Impressions:  - Diffuse hypokinesis with akinesis of the distal anteroseptal wall  and apex; overall moderate to severe reduction in LV function;  grade 1 diastolic dysfunction.    02/07/14; ETT Conclusions Poor exercise tolerance for age Prolonged HR and BP recovery. No chest pain. THR was achieved. Duke Treadmill Score: +5 No exercise induced ischemic EKG changes noted at given workload.  03/18/06: Echocardiogram SUMMARY - Overall left ventricular systolic function was mildly to moderately decreased. Left ventricular ejection fraction was estimated to be40 %. There was akinesis of the mid-distal anteroseptal wall. There was akinesis of the apical wall. - Normal right ventricle There was the appearance of a catheter or pacing wire in the right ventricle. 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 IMPRESSIONS - There is a density in the right atrium adjacent to the ICD  which is probably a Chiari network. No obvious vegetation.   Recent Labs: No results found for requested labs within last 8760 hours.  No results found for requested labs within last 8760 hours.   CrCl cannot be calculated (Patient's most recent lab result is older than the maximum 21 days allowed.).   Wt Readings from Last 3 Encounters:  07/12/19 158 lb (71.7 kg)  10/21/17 172 lb 3.2 oz (78.1 kg)  06/19/16 170 lb (77.1 kg)     Other studies reviewed: Additional studies/records reviewed today include: summarized above  ASSESSMENT AND PLAN:  1. ICD  2. ICM 3. Chronic CHF     ***   4. HTN stable    Disposition: F/u with ***  Current medicines are reviewed at length with the patient today.  The patient did not have any concerns regarding medicines.  Norma Fredrickson, PA-C 01/07/2021 6:29 PM     [facility] [address] [place] [address] [phone] (office)  [phone] (fax)

## 2021-01-09 ENCOUNTER — Encounter: Payer: Medicare Other | Admitting: Physician Assistant

## 2021-01-11 ENCOUNTER — Encounter: Payer: Self-pay | Admitting: Physician Assistant

## 2021-01-11 ENCOUNTER — Ambulatory Visit (INDEPENDENT_AMBULATORY_CARE_PROVIDER_SITE_OTHER): Payer: Medicare Other | Admitting: Physician Assistant

## 2021-01-11 ENCOUNTER — Other Ambulatory Visit: Payer: Self-pay

## 2021-01-11 VITALS — BP 132/74 | HR 105 | Ht 63.0 in | Wt 148.8 lb

## 2021-01-11 DIAGNOSIS — I472 Ventricular tachycardia, unspecified: Secondary | ICD-10-CM

## 2021-01-11 DIAGNOSIS — I2510 Atherosclerotic heart disease of native coronary artery without angina pectoris: Secondary | ICD-10-CM | POA: Diagnosis not present

## 2021-01-11 DIAGNOSIS — I255 Ischemic cardiomyopathy: Secondary | ICD-10-CM

## 2021-01-11 DIAGNOSIS — I4901 Ventricular fibrillation: Secondary | ICD-10-CM

## 2021-01-11 DIAGNOSIS — I5022 Chronic systolic (congestive) heart failure: Secondary | ICD-10-CM

## 2021-01-11 DIAGNOSIS — Z79899 Other long term (current) drug therapy: Secondary | ICD-10-CM

## 2021-01-11 LAB — CUP PACEART INCLINIC DEVICE CHECK
Battery Remaining Longevity: 40 mo
Brady Statistic RV Percent Paced: 0 %
Date Time Interrogation Session: 20220422142149
HighPow Impedance: 65.6484
Implantable Lead Implant Date: 20070501
Implantable Lead Location: 753860
Implantable Lead Model: 7000
Implantable Pulse Generator Implant Date: 20141027
Lead Channel Impedance Value: 550 Ohm
Lead Channel Pacing Threshold Amplitude: 0.75 V
Lead Channel Pacing Threshold Amplitude: 0.75 V
Lead Channel Pacing Threshold Pulse Width: 0.7 ms
Lead Channel Pacing Threshold Pulse Width: 0.7 ms
Lead Channel Sensing Intrinsic Amplitude: 7.4 mV
Lead Channel Setting Pacing Amplitude: 2.5 V
Lead Channel Setting Pacing Pulse Width: 0.7 ms
Lead Channel Setting Sensing Sensitivity: 0.5 mV
Pulse Gen Serial Number: 7138508

## 2021-01-11 NOTE — Progress Notes (Signed)
Cardiology Office Note Date:  01/11/2021  Patient ID:  Christopher, Hodges Dec 06, 1945, MRN [id number] PCP:  Treasa School, PA-C  Electrophysiologist: Dr. Ladona Ridgel    Chief Complaint: over due annual visit  History of Present Illness: [name] is a 75 y.o. male with history of VT w/ICD, ICM, CAD (very remote out of hospital MI), CHF, HTN, HLD, tachy-brady syndrome.  Has irregular follow up, some years without visits  He comes in today to be seen for Dr. Ladona Ridgel, last seen by him Oct 2020, doing well, no changes were made.  TODAY He is accompanied by his son. He has been spending the majority of his time back home in [place], his wife with some visa issues unable to come here.  Just arrived back a couple weeks ago and will be going back to [place] in another few weeks for another few months.  He denies any CP. Does have DOE.  He was hospitalized in [place] in Feb, said he started to feel terrible and very hard time breathing and was brought to the hospital.  He was there about 4 days getting medicines through his IV. He does not know specifics, did not have any procedures.  But thinks they did do some studies like EKGs "or something" nothing invasive  Neither the patient or his son have any specific details. He denies syncope or shocks  He has felt better since   He does not kow what his medicines are and his son states that he does not take his medicines anyway.  That when he feels good he doesn't think he needs to.    Device information:  SJM single chamber ICD, gen change 07/18/16 Dr. Ladona Ridgel, original device 01/20/06 + history of appropriate shocks for VT on a number of occassions over the years, going back to 2012 Feb 2022 for VT and VF   AAD Hx amiodarone chronically 2015 >>> unknown why stopped 2020   Past Medical History:  Diagnosis Date  . CAD (coronary artery disease)    unspec site  . Cardiomyopathy, ischemic   . Congestive heart failure (HCC)     unspecified  . Diabetes mellitus without complication (HCC)   . Essential hypertension 02/03/2009   Qualifier: Diagnosis of  By: Bascom Levels, RMA, Sherri    . HLD (hyperlipidemia)    mixed  . HTN (hypertension)    unspecified  . Ischemic cardiomyopathy 06/23/2016  . Sick sinus syndrome (HCC)    tachy-brday syndrome  . Tibia and fibula open fracture, right, type I or II, with nonunion, subsequent encounter 06/23/2016    Past Surgical History:  Procedure Laterality Date  . APPENDECTOMY    . CATARACT EXTRACTION    . FRACTURE SURGERY     left leg  . HARDWARE REMOVAL Left 06/19/2016   Procedure: HARDWARE REMOVAL LEFT TIBIAL;  Surgeon: Myrene Galas, MD;  Location: [facility] OR;  Service: Orthopedics;  Laterality: Left;  . HERNIA REPAIR    . icd  5/07   for cardiomyopathy- St Jude Atlas 2 VR  . IMPLANTABLE CARDIOVERTER DEFIBRILLATOR (ICD) GENERATOR CHANGE N/A 07/18/2013   Procedure: ICD GENERATOR CHANGE;  Surgeon: Marinus Maw, MD;  Location: [facility] CATH LAB;  Service: Cardiovascular;  Laterality: N/A;  . INTRAMEDULLARY (IM) CEMENTED ANTIOBIOTIC NAIL Left 06/19/2016   Procedure: INTRAMEDULLARY (IM) NAIL TIBIAL ANTIBIOTIC NAIL;  Surgeon: Myrene Galas, MD;  Location: [facility] OR;  Service: Orthopedics;  Laterality: Left;  . TIBIA IM NAIL INSERTION Left 08/12/2016   Procedure: INTRAMEDULLARY (IM) NAIL TIBIAL/REMOVAL  ANTIBOTIC NAIL;  Surgeon: Myrene Galas, MD;  Location: [facility] OR;  Service: Orthopedics;  Laterality: Left;    Current Outpatient Medications  Medication Sig Dispense Refill  . acetaminophen (TYLENOL) 500 MG tablet Take 1-2 tablets (500-1,000 mg total) by mouth every 6 (six) hours as needed for mild pain or moderate pain. 90 tablet 0  . ADVAIR DISKUS 500-50 MCG/DOSE AEPB Inhale 2 puffs into the lungs daily.    Marland Kitchen atorvastatin (LIPITOR) 40 MG tablet Take 40 mg by mouth daily.  3  . carvedilol (COREG) 12.5 MG tablet Take 1 tablet by mouth daily.    Marland Kitchen gabapentin (NEURONTIN) 300 MG capsule Take 1 capsule  by mouth daily.    Marland Kitchen glimepiride (AMARYL) 4 MG tablet Take 4 mg by mouth daily with breakfast.    . metFORMIN (GLUCOPHAGE) 850 MG tablet Take 850 mg by mouth 2 (two) times daily.     No current facility-administered medications for this visit.    Allergies:   Patient has no known allergies.   Social History:  The patient  reports that he quit smoking about 17 years ago. His smoking use included cigarettes. He has never used smokeless tobacco. He reports that he does not drink alcohol and does not use drugs.   Family History:  The patient's family history is not on file.  ROS:  Please see the history of present illness.    All other systems are reviewed and otherwise negative.   PHYSICAL EXAM:  VS:  BP 132/74   Pulse (!) 105   Ht 5\' 3"  (1.6 m)   Wt 148 lb 12.8 oz (67.5 kg)   SpO2 97%   BMI 26.36 kg/m  BMI: Body mass index is 26.36 kg/m. Well nourished, well developed, in no acute distress HEENT: normocephalic, atraumatic Neck: no JVD, carotid bruits or masses Cardiac:  RRR; no significant murmurs, no rubs, or gallops Lungs:  CTA b/l, no wheezing, rhonchi or rales Abd: soft, nontender MS: no deformity or atrophy Ext: no edema Skin: warm and dry, no rash Neuro:  No gross deficits appreciated Psych: euthymic mood, full affect  ICD site is stable, no tethering or discomfort   EKG:  Done today and reviewed by myself shows  ST 105bpm, IVCD  Device interrogation done today and reviewed by myself:  Battery and lead measurements are good 11/11/2020  VT 173bpm > ATP failed (x3) > HV x1  successful VF 240bpmATP during charge failed > HV tx x1 succesful None further since that day  05/20/2016: TTE Study Conclusions  - Left ventricle: The cavity size was normal. Wall thickness was  normal. Systolic function was moderately to severely reduced. The  estimated ejection fraction was in the range of 30% to 35%.  Diffuse hypokinesis. There is akinesis of the   mid-apicalanteroseptal and apical myocardium. Doppler parameters  are consistent with abnormal left ventricular relaxation (grade 1  diastolic dysfunction).   Impressions:  - Diffuse hypokinesis with akinesis of the distal anteroseptal wall  and apex; overall moderate to severe reduction in LV function;  grade 1 diastolic dysfunction.    02/07/14; ETT Conclusions Poor exercise tolerance for age Prolonged HR and BP recovery. No chest pain. THR was achieved. Duke Treadmill Score: +5 No exercise induced ischemic EKG changes noted at given workload.  03/18/06: Echocardiogram SUMMARY - Overall left ventricular systolic function was mildly to moderately decreased. Left ventricular ejection fraction was estimated to be40 %. There was akinesis of the mid-distal anteroseptal wall. There was akinesis of  the apical wall. - Normal right ventricle There was the appearance of a catheter or pacing wire in the right ventricle. 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 No obvious vegetation.   Recent Labs: No results found for requested labs within last 8760 hours.  No results found for requested labs within last 8760 hours.   CrCl cannot be calculated (Patient's most recent lab result is older than the maximum 21 days allowed.).   Wt Readings from Last 3 Encounters:  01/11/21 148 lb 12.8 oz (67.5 kg)  07/12/19 158 lb (71.7 kg)  10/21/17 172 lb 3.2 oz (78.1 kg)     Other studies reviewed: Additional studies/records reviewed today include: summarized above  ASSESSMENT AND PLAN:  1. ICD     Intact function, no programming changes made  He does not have his transmitter lugged in and is often out of the [place] He is asked to plug in his monitor and keept it by his bed so when he is in  the country we can get his remotes done Soon they hope his wife will be able to travel and he will be in the [place] more consistantly   2. ICM 3. Chronic CHF     No symptoms or exam findings of volume OL     CorVue is trending upwards, and above threshold   4. HTN  stable  5. VT/VF     They report the timing lines up with his hospital stay     He does not know his medicines and his son states he does not take them.     I have urged the importance of his medicines and discussed the severity of his arrhythmias and potentially a life ending event.      I have asked that they call with an accurate list of what he is currently prescribed.      I will have him back in 2 weeks to re-visit and follow up on his medicines and compliance  If they have records from his sopital stay or any test results they will  Bring them   Disposition: F/u as above  Current medicines are reviewed at length with the patient today.  The patient did not have any concerns regarding medicines.  Christopher Fredrickson, PA-C 01/11/2021 1:23 PM     [facility] [address] [place] [address] [phone] (office)  [phone] (fax)

## 2021-01-11 NOTE — Patient Instructions (Addendum)
Medication Instructions:   Your physician recommends that you continue on your current medications as directed. Please refer to the Current Medication list given to you today.   *If you need a refill on your cardiac medications before your next appointment, please call your pharmacy*   Lab Work:  NONE ORDERED  TODAY    If you have labs (blood work) drawn today and your tests are completely normal, you will receive your results only by: Marland Kitchen MyChart Message (if you have MyChart) OR . A paper copy in the mail If you have any lab test that is abnormal or we need to change your treatment, we will call you to review the results.   Testing/Procedures: NONE ORDERED  TODAY    Follow-Up: At [facility], you and your health needs are our priority.  As part of our continuing mission to provide you with exceptional heart care, we have created designated Provider Care Teams.  These Care Teams include your primary Cardiologist (physician) and Advanced Practice Providers (APPs -  Physician Assistants and Nurse Practitioners) who all work together to provide you with the care you need, when you need it.  We recommend signing up for the patient portal called "MyChart".  Sign up information is provided on this After Visit Summary.  MyChart is used to connect with patients for Virtual Visits (Telemedicine).  Patients are able to view lab/test results, encounter notes, upcoming appointments, etc.  Non-urgent messages can be sent to your provider as well.   To learn more about what you can do with MyChart, go to [email or web address].    Your next appointment:    2 week(s) ( CONTACT [facility] FOR SCHEDULING ISSUES )  The format for your next appointment:   In Person  Provider:   Francis Dowse, PA-C   Other Instructions  PLEASE CONTACT OFFICE BACK  WITH ACCURATE MEDICATION LIST

## 2021-01-20 NOTE — Progress Notes (Signed)
Cardiology Office Note Date:  01/21/2021  Patient ID:  [name], [place] May 11, 1946, MRN [id number] PCP:  Treasa School, PA-C  Electrophysiologist: Dr. Ladona Ridgel    Chief Complaint: planned follow up  History of Present Illness: [name] is a 75 y.o. male with history of VT w/ICD, ICM, CAD (very remote out of hospital MI), CHF, HTN, HLD, tachy-brady syndrome.  Has irregular follow up, some years without visits  He comes in today to be seen for Dr. Ladona Ridgel, last seen by him Oct 2020, doing well, no changes were made.   I saw him 01/11/21 He is accompanied by his son. He has been spending the majority of his time back home in [place], his wife with some visa issues unable to come here. Just arrived back a couple weeks ago and will be going back to [place] in another few weeks for another few months. He denies any CP. Does have DOE.  He was hospitalized in [place] in Feb, said he started to feel terrible and very hard time breathing and was brought to the hospital.  He was there about 4 days getting medicines through his IV. He does not know specifics, did not have any procedures.  But thinks they did do some studies like EKGs "or something" nothing invasive  Neither the patient or his son have any specific details. He denies syncope or shocks  He has felt better since   He does not kow what his medicines are and his son states that he does not take his medicines anyway.  That when he feels good he doesn't think he needs to.  Device check noted appropriate therapies in feb that they reported lined up with his hospitalization in [place]. He was not transmitting, not taking his medicines and did not know he had prescribed for him. We discussed at length the difficulty in management [place] and unknown meds and noncompliance.  Planned for a 2 week follow up bringing his meds and strategies for follow up  TODAY He is again accompanied by his son. They  both report he is taking his medicines. He reports tolerating well No CP, palpitations or cardiac awareness, no dizzy spells, near syncope or syncope. He travels back to [place] 02/04/21 for a couple months We received a list of medicines from his PMD, he had labs done there as well.   Device information:  SJM single chamber ICD, gen change 07/18/16 Dr. Ladona Ridgel, original device 01/20/06 + history of appropriate shocks for VT on a number of occassions over the years, going back to 02012 Feb 2022 + appropriate therapies VT.VF apparently was hospitalized in [place] at this time  AAD Hx amiodarone chronically 2015.  Seems may have been stopped at some point though back on his list again and reported as actively taking   Past Medical History:  Diagnosis Date  . CAD (coronary artery disease)    unspec site  . Cardiomyopathy, ischemic   . Congestive heart failure (HCC)    unspecified  . Diabetes mellitus without complication (HCC)   . Essential hypertension 02/03/2009   Qualifier: Diagnosis of  By: Bascom Levels, RMA, Sherri    . HLD (hyperlipidemia)    mixed  . HTN (hypertension)    unspecified  . Ischemic cardiomyopathy 06/23/2016  . Sick sinus syndrome (HCC)    tachy-brday syndrome  . Tibia and fibula open fracture, right, type I or II, with nonunion, subsequent encounter 06/23/2016    Past Surgical History:  Procedure  Laterality Date  . APPENDECTOMY    . CATARACT EXTRACTION    . FRACTURE SURGERY     left leg  . HARDWARE REMOVAL Left 06/19/2016   Procedure: HARDWARE REMOVAL LEFT TIBIAL;  Surgeon: Myrene Galas, MD;  [facility] OR;  Service: Orthopedics;  Laterality: Left;  . HERNIA REPAIR    . icd  5/07   for cardiomyopathy- St Jude Atlas 2 VR  . IMPLANTABLE CARDIOVERTER DEFIBRILLATOR (ICD) GENERATOR CHANGE N/A 07/18/2013   Procedure: ICD GENERATOR CHANGE;  Surgeon: Marinus Maw, MD;  [facility] C[facility] LAB;  Service: Cardiovascular;  Laterality: N/A;  . INTRAMEDULLARY (IM)  CEMENTED ANTIOBIOTIC NAIL Left 06/19/2016   Procedure: INTRAMEDULLARY (IM) NAIL TIBIAL ANTIBIOTIC NAIL;  Surgeon: Myrene Galas, MD;  Location: [facility] OR;  Service: Orthopedics;  Laterality: Left;  . TIBIA IM NAIL INSERTION Left 08/12/2016   Procedure: INTRAMEDULLARY (IM) NAIL TIBIAL/REMOVAL ANTIBOTIC NAIL;  Surgeon: Myrene Galas, MD;  Location: [facility] OR;  Service: Orthopedics;  Laterality: Left;    Current Outpatient Medications  Medication Sig Dispense Refill  . amiodarone (PACERONE) 200 MG tablet Take 200 mg by mouth daily.    Marland Kitchen atorvastatin (LIPITOR) 40 MG tablet Take 40 mg by mouth daily.  3  . carvedilol (COREG) 12.5 MG tablet Take 1 tablet by mouth daily.    . Fluticasone-Umeclidin-Vilant (TRELEGY ELLIPTA) 200-62.5-25 MCG/INH AEPB Inhale 1 puff into the lungs daily.    Marland Kitchen gabapentin (NEURONTIN) 300 MG capsule Take 1 capsule by mouth daily.    Marland Kitchen glimepiride (AMARYL) 4 MG tablet Take 4 mg by mouth daily with breakfast.    . lisinopril (ZESTRIL) 10 MG tablet Take 1 tablet by mouth daily.    . metFORMIN (GLUCOPHAGE) 850 MG tablet Take 850 mg by mouth 2 (two) times daily.    . montelukast (SINGULAIR) 10 MG tablet Take 1 tablet by mouth daily.     No current facility-administered medications for this visit.    Allergies:   Patient has no known allergies.   Social History:  The patient  reports that he quit smoking about 17 years ago. His smoking use included cigarettes. He has never used smokeless tobacco. He reports that he does not drink alcohol and does not use drugs.   Family History:  The patient's family history is unknown, he does not think anything particular runs in his family  ROS:  Please see the history of present illness.    All other systems are reviewed and otherwise negative.   PHYSICAL EXAM:  VS:  BP (!) 140/58   Pulse 84   Ht 5\' 3"  (1.6 m)   Wt 148 lb 6.4 oz (67.3 kg)   SpO2 96%   BMI 26.29 kg/m  BMI: Body mass index is 26.29 kg/m. Well nourished, well developed, in  no acute distress HEENT: normocephalic, atraumatic Neck: no JVD, carotid bruits or masses Cardiac:  RRR; no significant murmurs, no rubs, or gallops Lungs:  CTA b/l, no wheezing, rhonchi or rales Abd: soft, nontender MS: no deformity or atrophy Ext: no edema Skin: warm and dry, no rash Neuro:  No gross deficits appreciated Psych: euthymic mood, full affect  ICD site is stable, no tethering or discomfort   EKG:  Not done today  Device interrogation done today and reviewed by myself:  Battery and lead measurements are good No arrhythmias CorVue trending down, at threshold.  05/20/2016: TTE Study Conclusions  - Left ventricle: The cavity size was normal. Wall thickness was  normal. Systolic function was  moderately to severely reduced. The  estimated ejection fraction was in the range of 30% to 35%.  Diffuse hypokinesis. There is akinesis of the  mid-apicalanteroseptal and apical myocardium. Doppler parameters  are consistent with abnormal left ventricular relaxation (grade 1  diastolic dysfunction).   Impressions:  - Diffuse hypokinesis with akinesis of the distal anteroseptal wall  and apex; overall moderate to severe reduction in LV function;  grade 1 diastolic dysfunction.    02/07/14; ETT Conclusions Poor exercise tolerance for age Prolonged HR and BP recovery. No chest pain. THR was achieved. Duke Treadmill Score: +5 No exercise induced ischemic EKG changes noted at given workload.  03/18/06: Echocardiogram SUMMARY - Overall left ventricular systolic function was mildly to moderately decreased. Left ventricular ejection fraction was estimated to be40 %. There was akinesis of the mid-distal anteroseptal wall. There was akinesis of the apical wall. - Normal right ventricle There was the appearance of a catheter or pacing wire in the right ventricle. 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 No obvious vegetation.   Recent Labs: No results found for requested labs within last 8760 hours.  No results found for requested labs within last 8760 hours.   CrCl cannot be calculated (Patient's most recent lab result is older than the maximum 21 days allowed.).   Wt Readings from Last 3 Encounters:  01/21/21 148 lb 6.4 oz (67.3 kg)  01/11/21 148 lb 12.8 oz (67.5 kg)  07/12/19 158 lb (71.7 kg)     Other studies reviewed: Additional studies/records reviewed today include: summarized above  ASSESSMENT AND PLAN:  1. ICD     Intact function, no programming changes amde  2. ICM     He denies any known hx of CAD, or having any stents 3. Chronic CHF     CorVue trending towrads volume, no symptoms or exam findings to suggest volume OL     On BB, ACE     cautioned on salt intake and discussed monitoring for volume/edema, SOB   4. HTN stable  5. VT/VF     Was hospitalized in [place] with his last arrhythmia/therpaies     Unknown work up/findings     On amiodarone, though likely was not taking at the time of his last VT/VT therapies    Disposition: F/u with remotes, he will take his transmitter to [place], we will see him back in 44mo, sooner if needed.  Current medicines are reviewed at length with the patient today.  The patient did not have any concerns regarding medicines.  Norma Fredrickson, PA-C 01/21/2021 1:01 PM     [facility] [address] [place] [address] [phone] (office)  [phone] (fax)

## 2021-01-21 ENCOUNTER — Encounter: Payer: Self-pay | Admitting: Physician Assistant

## 2021-01-21 ENCOUNTER — Other Ambulatory Visit: Payer: Self-pay

## 2021-01-21 ENCOUNTER — Ambulatory Visit (INDEPENDENT_AMBULATORY_CARE_PROVIDER_SITE_OTHER): Payer: Medicare Other | Admitting: Physician Assistant

## 2021-01-21 VITALS — BP 140/58 | HR 84 | Ht 63.0 in | Wt 148.4 lb

## 2021-01-21 DIAGNOSIS — Z95810 Presence of automatic (implantable) cardiac defibrillator: Secondary | ICD-10-CM

## 2021-01-21 DIAGNOSIS — I5022 Chronic systolic (congestive) heart failure: Secondary | ICD-10-CM

## 2021-01-21 DIAGNOSIS — I10 Essential (primary) hypertension: Secondary | ICD-10-CM

## 2021-01-21 DIAGNOSIS — I472 Ventricular tachycardia, unspecified: Secondary | ICD-10-CM

## 2021-01-21 DIAGNOSIS — I255 Ischemic cardiomyopathy: Secondary | ICD-10-CM

## 2021-01-21 LAB — CUP PACEART INCLINIC DEVICE CHECK
Battery Remaining Longevity: 40 mo
Brady Statistic RV Percent Paced: 0 %
Date Time Interrogation Session: 20220502160032
HighPow Impedance: 65.3313
Implantable Lead Implant Date: 20070501
Implantable Lead Location: 753860
Implantable Lead Model: 7000
Implantable Pulse Generator Implant Date: 20141027
Lead Channel Impedance Value: 512.5 Ohm
Lead Channel Pacing Threshold Amplitude: 0.75 V
Lead Channel Pacing Threshold Amplitude: 0.75 V
Lead Channel Pacing Threshold Pulse Width: 0.7 ms
Lead Channel Pacing Threshold Pulse Width: 0.7 ms
Lead Channel Sensing Intrinsic Amplitude: 6 mV
Lead Channel Setting Pacing Amplitude: 2.5 V
Lead Channel Setting Pacing Pulse Width: 0.7 ms
Lead Channel Setting Sensing Sensitivity: 0.5 mV
Pulse Gen Serial Number: 7138508

## 2021-01-21 NOTE — Patient Instructions (Signed)
Medication Instructions:    Your physician recommends that you continue on your current medications as directed. Please refer to the Current Medication list given to you today.  *If you need a refill on your cardiac medications before your next appointment, please call your pharmacy*   Lab Work: NONE ORDERED  TODAY  If you have labs (blood work) drawn today and your tests are completely normal, you will receive your results only by: . MyChart Message (if you have MyChart) OR . A paper copy in the mail If you have any lab test that is abnormal or we need to change your treatment, we will call you to review the results.   Testing/Procedures: NONE ORDERED  TODAY    Follow-Up: At [facility], you and your health needs are our priority.  As part of our continuing mission to provide you with exceptional heart care, we have created designated Provider Care Teams.  These Care Teams include your primary Cardiologist (physician) and Advanced Practice Providers (APPs -  Physician Assistants and Nurse Practitioners) who all work together to provide you with the care you need, when you need it.  We recommend signing up for the patient portal called "MyChart".  Sign up information is provided on this After Visit Summary.  MyChart is used to connect with patients for Virtual Visits (Telemedicine).  Patients are able to view lab/test results, encounter notes, upcoming appointments, etc.  Non-urgent messages can be sent to your provider as well.   To learn more about what you can do with MyChart, go to [email or web address].    Your next appointment:    6 month(s)  The format for your next appointment:   In Person  Provider:   You may see Dr. Taylor or one of the following Advanced Practice Providers on your designated Care Team:    Amber Seiler, NP  Renee Ursuy, PA-C  Michael "Andy" Tillery, PA-C    Other Instructions   

## 2021-12-04 NOTE — Progress Notes (Signed)
? ?Cardiology Office Note ?Date:  12/04/2021  ?Patient ID:  [name], DOB 1946-09-03, MRN [id number] ?PCP:  Treasa SchoolLand, Phillip, PA-C  ?Electrophysiologist: Dr. Ladona Ridgelaylor ? ?  ?Chief Complaint:  6 mo (overdue) ? ?History of Present Illness: ?[name] is a 76 y.o. male with history of VT w/ICD, ICM, CAD (very remote out of hospital MI), CHF, HTN, HLD, tachy-brady syndrome. ? ?Has irregular follow up, some years without visits ? ?He comes in today to be seen for Dr. Ladona Ridgelaylor, last seen by him Oct 2020, doing well, no changes were made. ? ? ?I saw him 01/11/21 ?He is accompanied by his son. ?He has been spending the majority of his time back home in [place], his wife with some visa issues unable to come here. ?Just arrived back a couple weeks ago and will be going back to [place] in another few weeks for another few months. ?He denies any CP. ?Does have DOE.  He was hospitalized in [place] in Feb, said he started to feel terrible and very hard time breathing and was brought to the hospital.  He was there about 4 days getting medicines through his IV. ?He does not know specifics, did not have any procedures.  But thinks they did do some studies like EKGs "or something" nothing invasive  ?Neither the patient or his son have any specific details. ?He denies syncope or shocks ?  ?He has felt better since  ?  ?He does not kow what his medicines are and his son states that he does not take his medicines anyway.  That when he feels good he doesn't think he needs to. ? ?Device check noted appropriate therapies in feb that they reported lined up with his hospitalization in [place]. ?He was not transmitting, not taking his medicines and did not know he had prescribed for him. ?We discussed at length the difficulty in management [place] of [place] and unknown meds and noncompliance. ? ?Planned for a 2 week follow up bringing his meds and strategies for follow up ? ?I saw him in May 2022 ?He is again accompanied by  his son. ?They both report he is taking his medicines. ?He reports tolerating well ?No CP, palpitations or cardiac awareness, no dizzy spells, near syncope or syncope. ?He travels back to [place] 02/04/21 for a couple months ?We received a list of medicines from his PMD, he had labs done there as well. ?No changes were made, planned to f/u 6mo, he was planning travel back to [place] ? ?TODAY ?He is again accompanied by his son who translates. ?The patient is back from [place] 2 weeks. ?4-6 weeks ago he was very ill w/fever, very weak, and when he eventually went for treatment told he had a bad pneumonia. ?This is much improved, but was stick for about 3 weeks. ?He saw Dr. Beaulah Dinninganveer here, his son says is a lung doctor who did a CXR and mentioned some fluid on his lung, no new medicines or recommendations with plans to see him again in f/u, and if still there, might drain it. ?The patient reports compliance with his medicines, might forget once in a while, but for the most part taking all his medicines. ?NO dizzy spells, near syncope or syncope. ?No CP, denies palpitations ?Was quite SOB and with a cough while he was so ill, but much improved. ? ? ? ?Device information:  ?SJM single chamber ICD, gen change 07/18/16 Dr. Ladona Ridgelaylor, original device 01/20/06 ?+ history of appropriate shocks for VT  on a number of occassions over the years, going back to 02012 ?Feb 2022 + appropriate therapies VT.VF apparently was hospitalized in [place] at this time (and reports likely was not taking his amio or Meds at the time ? ?AAD Hx ?amiodarone chronically 2015.  Seems may have been stopped at some point though back on his list again and reported as actively taking ? ? ?Past Medical History:  ?Diagnosis Date  ? CAD (coronary artery disease)   ? unspec site  ? Cardiomyopathy, ischemic   ? Congestive heart failure (HCC)   ? unspecified  ? Diabetes mellitus without complication (HCC)   ? Essential hypertension 02/03/2009  ? Qualifier:  Diagnosis of  By: Bascom Levels, RMA, Sherri    ? HLD (hyperlipidemia)   ? mixed  ? HTN (hypertension)   ? unspecified  ? Ischemic cardiomyopathy 06/23/2016  ? Sick sinus syndrome (HCC)   ? tachy-brday syndrome  ? Tibia and fibula open fracture, right, type I or II, with nonunion, subsequent encounter 06/23/2016  ? ? ?Past Surgical History:  ?Procedure Laterality Date  ? APPENDECTOMY    ? CATARACT EXTRACTION    ? FRACTURE SURGERY    ? left leg  ? HARDWARE REMOVAL Left 06/19/2016  ? Procedure: HARDWARE REMOVAL LEFT TIBIAL;  Surgeon: Myrene Galas, MD;  Location: [facility] OR;  Service: Orthopedics;  Laterality: Left;  ? HERNIA REPAIR    ? icd  5/07  ? for cardiomyopathy- St Jude Atlas 2 VR  ? IMPLANTABLE CARDIOVERTER DEFIBRILLATOR (ICD) GENERATOR CHANGE N/A 07/18/2013  ? Procedure: ICD GENERATOR CHANGE;  Surgeon: Marinus Maw, MD;  Location: [facility] CATH LAB;  Service: Cardiovascular;  Laterality: N/A;  ? INTRAMEDULLARY (IM) CEMENTED ANTIOBIOTIC NAIL Left 06/19/2016  ? Procedure: INTRAMEDULLARY (IM) NAIL TIBIAL ANTIBIOTIC NAIL;  Surgeon: Myrene Galas, MD;  Location: [facility] OR;  Service: Orthopedics;  Laterality: Left;  ? TIBIA IM NAIL INSERTION Left 08/12/2016  ? Procedure: INTRAMEDULLARY (IM) NAIL TIBIAL/REMOVAL ANTIBOTIC NAIL;  Surgeon: Myrene Galas, MD;  Location: [facility] OR;  Service: Orthopedics;  Laterality: Left;  ? ? ?Current Outpatient Medications  ?Medication Sig Dispense Refill  ? amiodarone (PACERONE) 200 MG tablet Take 200 mg by mouth daily.    ? atorvastatin (LIPITOR) 40 MG tablet Take 40 mg by mouth daily.  3  ? carvedilol (COREG) 12.5 MG tablet Take 1 tablet by mouth daily.    ? Fluticasone-Umeclidin-Vilant (TRELEGY ELLIPTA) 200-62.5-25 MCG/INH AEPB Inhale 1 puff into the lungs daily.    ? gabapentin (NEURONTIN) 300 MG capsule Take 1 capsule by mouth daily.    ? glimepiride (AMARYL) 4 MG tablet Take 4 mg by mouth daily with breakfast.    ? lisinopril (ZESTRIL) 10 MG tablet Take 1 tablet by mouth daily.    ? metFORMIN  (GLUCOPHAGE) 850 MG tablet Take 850 mg by mouth 2 (two) times daily.    ? montelukast (SINGULAIR) 10 MG tablet Take 1 tablet by mouth daily.    ? ?No current facility-administered medications for this visit.  ? ? ?Allergies:   Patient has no known allergies.  ? ?Social History:  The patient  reports that he quit smoking about 18 years ago. His smoking use included cigarettes. He has never used smokeless tobacco. He reports that he does not drink alcohol and does not use drugs.  ? ?Family History:  The patient's family history is unknown, he does not think anything particular runs in his family ? ?ROS:  Please see the history of present illness.    ?  All other systems are reviewed and otherwise negative.  ? ?PHYSICAL EXAM:  ?VS:  There were no vitals taken for this visit. BMI: There is no height or weight on file to calculate BMI. ?Well nourished, well developed, in no acute distress ?HEENT: normocephalic, atraumatic ?Neck: no JVD, carotid bruits or masses ?Cardiac:  RRR; no significant murmurs, no rubs, or gallops ?Lungs:  slightly diminished L base, CTA b/l otherwise, no wheezing, rhonchi or rales ?Abd: soft, nontender ?MS: no deformity or atrophy ?Ext: no edema ?Skin: warm and dry, no rash ?Neuro:  No gross deficits appreciated ?Psych: euthymic mood, full affect ? ?ICD site is stable, no tethering or discomfort ? ? ?EKG:  done today and reviewed by myself ?SR, no changes ? ?Device interrogation done today and reviewed by myself:  ?Battery and lead measurements are good ?12 NSVT since May 2022 ?He has a couple morphologies ?One episode, unclear full duration is a bigeminal pattern, another appears morphology-wise his baseline w/ectopy ? ?05/20/2016: TTE ?Study Conclusions  ?- Left ventricle: The cavity size was normal. Wall thickness was  ?  normal. Systolic function was moderately to severely reduced. The  ?  estimated ejection fraction was in the range of 30% to 35%.  ?  Diffuse hypokinesis. There is akinesis of  the  ?  mid-apicalanteroseptal and apical myocardium. Doppler parameters  ?  are consistent with abnormal left ventricular relaxation (grade 1  ?  diastolic dysfunction).  ? ?Impressions:  ?- Diffuse hypokinesis with aki

## 2021-12-09 ENCOUNTER — Ambulatory Visit (INDEPENDENT_AMBULATORY_CARE_PROVIDER_SITE_OTHER): Payer: Medicare Other | Admitting: Physician Assistant

## 2021-12-09 ENCOUNTER — Other Ambulatory Visit: Payer: Self-pay

## 2021-12-09 VITALS — BP 124/68 | HR 79 | Ht 63.0 in | Wt 144.0 lb

## 2021-12-09 DIAGNOSIS — Z79899 Other long term (current) drug therapy: Secondary | ICD-10-CM | POA: Diagnosis not present

## 2021-12-09 DIAGNOSIS — I472 Ventricular tachycardia, unspecified: Secondary | ICD-10-CM

## 2021-12-09 DIAGNOSIS — I5022 Chronic systolic (congestive) heart failure: Secondary | ICD-10-CM

## 2021-12-09 DIAGNOSIS — I255 Ischemic cardiomyopathy: Secondary | ICD-10-CM | POA: Diagnosis not present

## 2021-12-09 DIAGNOSIS — I4901 Ventricular fibrillation: Secondary | ICD-10-CM | POA: Diagnosis not present

## 2021-12-09 DIAGNOSIS — Z95810 Presence of automatic (implantable) cardiac defibrillator: Secondary | ICD-10-CM

## 2021-12-09 DIAGNOSIS — I2510 Atherosclerotic heart disease of native coronary artery without angina pectoris: Secondary | ICD-10-CM

## 2021-12-09 LAB — CUP PACEART INCLINIC DEVICE CHECK
Battery Remaining Longevity: 40 mo
Brady Statistic RV Percent Paced: 0.01 %
Date Time Interrogation Session: 20230320130118
HighPow Impedance: 64.125
Implantable Lead Implant Date: 20070501
Implantable Lead Location: 753860
Implantable Lead Model: 7000
Implantable Pulse Generator Implant Date: 20141027
Lead Channel Impedance Value: 462.5 Ohm
Lead Channel Pacing Threshold Amplitude: 0.75 V
Lead Channel Pacing Threshold Amplitude: 0.75 V
Lead Channel Pacing Threshold Pulse Width: 0.5 ms
Lead Channel Pacing Threshold Pulse Width: 0.5 ms
Lead Channel Sensing Intrinsic Amplitude: 12 mV
Lead Channel Setting Pacing Amplitude: 2.5 V
Lead Channel Setting Pacing Pulse Width: 0.5 ms
Lead Channel Setting Sensing Sensitivity: 0.5 mV
Pulse Gen Serial Number: 7138508

## 2021-12-09 LAB — COMPREHENSIVE METABOLIC PANEL
ALT: 12 IU/L (ref 0–44)
AST: 18 IU/L (ref 0–40)
Albumin/Globulin Ratio: 1 — ABNORMAL LOW (ref 1.2–2.2)
Albumin: 3.8 g/dL (ref 3.7–4.7)
Alkaline Phosphatase: 96 IU/L (ref 44–121)
BUN/Creatinine Ratio: 18 (ref 10–24)
BUN: 18 mg/dL (ref 8–27)
Bilirubin Total: 0.3 mg/dL (ref 0.0–1.2)
CO2: 25 mmol/L (ref 20–29)
Calcium: 9.8 mg/dL (ref 8.6–10.2)
Chloride: 96 mmol/L (ref 96–106)
Creatinine, Ser: 1.01 mg/dL (ref 0.76–1.27)
Globulin, Total: 3.8 g/dL (ref 1.5–4.5)
Glucose: 422 mg/dL — ABNORMAL HIGH (ref 70–99)
Potassium: 5.1 mmol/L (ref 3.5–5.2)
Sodium: 132 mmol/L — ABNORMAL LOW (ref 134–144)
Total Protein: 7.6 g/dL (ref 6.0–8.5)
eGFR: 77 mL/min/{1.73_m2} (ref >59)

## 2021-12-09 LAB — TSH: TSH: 3.33 u[IU]/mL (ref 0.450–4.500)

## 2021-12-09 MED ORDER — CARVEDILOL 25 MG PO TABS: 25.0000 mg | ORAL_TABLET | Freq: Two times a day (BID) | ORAL | 2 refills | Status: AC

## 2021-12-09 NOTE — Patient Instructions (Addendum)
Medication Instructions:  ? ?START TAKING COREG 25 MG TWICE A DAY  ? ? ?*If you need a refill on your cardiac medications before your next appointment, please call your pharmacy* ? ?Lab Work:  CMET AND TSH TODAY  ? ?If you have labs (blood work) drawn today and your tests are completely normal, you will receive your results only by: ?MyChart Message (if you have MyChart) OR ?A paper copy in the mail ?If you have any lab test that is abnormal or we need to change your treatment, we will call you to review the results. ? ? ?Testing/Procedures: NONE ORDERED  TODAY ? ? ?Follow-Up: ?At [facility], you and your health needs are our priority.  As part of our continuing mission to provide you with exceptional heart care, we have created designated Provider Care Teams.  These Care Teams include your primary Cardiologist (physician) and Advanced Practice Providers (APPs -  Physician Assistants and Nurse Practitioners) who all work together to provide you with the care you need, when you need it. ? ?We recommend signing up for the patient portal called "MyChart".  Sign up information is provided on this After Visit Summary.  MyChart is used to connect with patients for Virtual Visits (Telemedicine).  Patients are able to view lab/test results, encounter notes, upcoming appointments, etc.  Non-urgent messages can be sent to your provider as well.   ?To learn more about what you can do with MyChart, go to [email or web address].   ? ?Your next appointment:  6 MONTHS WITH DR Lovena Le  ? ?  AND    ? ? ?1 month(s) ? ?The format for your next appointment:   ?In Person ? ?Provider:   ?Tommye Standard, PA-C  ? ? ?Other Instructions ? ?

## 2022-01-05 NOTE — Progress Notes (Deleted)
Cardiology Office Note Date:  01/05/2022  Patient ID:  [name], [name] 03/31/1946, MRN [id number] PCP:  Treasa School, PA-C  Electrophysiologist: Dr. Ladona Ridgel    Chief Complaint:  6 mo (overdue)  History of Present Illness: [name] is a 76 y.o. male with history of VT w/ICD, ICM, CAD (very remote out of hospital MI), CHF, HTN, HLD, tachy-brady syndrome.  Has irregular follow up, some years without visits  He comes in today to be seen for Dr. Ladona Ridgel, last seen by him Oct 2020, doing well, no changes were made.   I saw him 01/11/21 He is accompanied by his son. He has been spending the majority of his time back home in [place], his wife with some visa issues unable to come here. Just arrived back a couple weeks ago and will be going back to [place] in another few weeks for another few months. He denies any CP. Does have DOE.  He was hospitalized in [place] in Feb, said he started to feel terrible and very hard time breathing and was brought to the hospital.  He was there about 4 days getting medicines through his IV. He does not know specifics, did not have any procedures.  But thinks they did do some studies like EKGs "or something" nothing invasive  Neither the patient or his son have any specific details. He denies syncope or shocks   He has felt better since    He does not kow what his medicines are and his son states that he does not take his medicines anyway.  That when he feels good he doesn't think he needs to.  Device check noted appropriate therapies in feb that they reported lined up with his hospitalization in [place]. He was not transmitting, not taking his medicines and did not know he had prescribed for him. We discussed at length the difficulty in management [place] and unknown meds and noncompliance.  Planned for a 2 week follow up bringing his meds and strategies for follow up  I saw him in May 2022 He is again accompanied by  his son. They both report he is taking his medicines. He reports tolerating well No CP, palpitations or cardiac awareness, no dizzy spells, near syncope or syncope. He travels back to [place] 02/04/21 for a couple months We received a list of medicines from his PMD, he had labs done there as well. No changes were made, planned to f/u 2mo, he was planning travel back to [place]  I saw him March 2023 He is again accompanied by his son who translates. The patient is back from [place] 2 weeks. 4-6 weeks ago he was very ill w/fever, very weak, and when he eventually went for treatment told he had a bad pneumonia. This is much improved, but was stick for about 3 weeks. He saw Dr. Beaulah Dinning here, his son says is a lung doctor who did a CXR and mentioned some fluid on his lung, no new medicines or recommendations with plans to see him again in f/u, and if still there, might drain it. The patient reports compliance with his medicines, might forget once in a while, but for the most part taking all his medicines. NO dizzy spells, near syncope or syncope. No CP, denies palpitations Was quite SOB and with a cough while he was so ill, but much improved. Noted to have some NSVTs and another episode of unknown duration of a bigeminal pattern Planned for labs and his coreg  increased.  Labs looked OK  *** symptoms, syncope, therapies? *** VT? *** [place]? *** volume *** pulm? *** GT next   Device information:  SJM single chamber ICD, gen change 07/18/16 Dr. Ladona Ridgel, original device 01/20/06 + history of appropriate shocks for VT on a number of occassions over the years, going back to 02012 Feb 2022 + appropriate therapies VT.VF apparently was hospitalized in [place] at this time (and reports likely was not taking his amio or Meds at the time  AAD Hx amiodarone chronically 2015.  Seems may have been stopped at some point though back on his list again and reported as actively taking   Past Medical  History:  Diagnosis Date   CAD (coronary artery disease)    unspec site   Cardiomyopathy, ischemic    Congestive heart failure (HCC)    unspecified   Diabetes mellitus without complication (HCC)    Essential hypertension 02/03/2009   Qualifier: Diagnosis of  By: Bascom Levels, RMA, Sherri     HLD (hyperlipidemia)    mixed   HTN (hypertension)    unspecified   Ischemic cardiomyopathy 06/23/2016   Sick sinus syndrome (HCC)    tachy-brday syndrome   Tibia and fibula open fracture, right, type I or II, with nonunion, subsequent encounter 06/23/2016    Past Surgical History:  Procedure Laterality Date   APPENDECTOMY     CATARACT EXTRACTION     FRACTURE SURGERY     left leg   HARDWARE REMOVAL Left 06/19/2016   Procedure: HARDWARE REMOVAL LEFT TIBIAL;  Surgeon: Myrene Galas, MD;  Location: [facility] OR;  Service: Orthopedics;  Laterality: Left;   HERNIA REPAIR     icd  5/07   for cardiomyopathy- [employer] Jude Atlas 2 VR   IMPLANTABLE CARDIOVERTER DEFIBRILLATOR (ICD) GENERATOR CHANGE N/A 07/18/2013   Procedure: ICD GENERATOR CHANGE;  Surgeon: Marinus Maw, MD;  Location: [facility] CATH LAB;  Service: Cardiovascular;  Laterality: N/A;   INTRAMEDULLARY (IM) CEMENTED ANTIOBIOTIC NAIL Left 06/19/2016   Procedure: INTRAMEDULLARY (IM) NAIL TIBIAL ANTIBIOTIC NAIL;  Surgeon: Myrene Galas, MD;  Location: [facility] OR;  Service: Orthopedics;  Laterality: Left;   TIBIA IM NAIL INSERTION Left 08/12/2016   Procedure: INTRAMEDULLARY (IM) NAIL TIBIAL/REMOVAL ANTIBOTIC NAIL;  Surgeon: Myrene Galas, MD;  Location: [facility] OR;  Service: Orthopedics;  Laterality: Left;    Current Outpatient Medications  Medication Sig Dispense Refill   amiodarone (PACERONE) 200 MG tablet Take 200 mg by mouth daily.     atorvastatin (LIPITOR) 40 MG tablet Take 40 mg by mouth daily.  3   carvedilol (COREG) 25 MG tablet Take 1 tablet (25 mg total) by mouth in the morning and at bedtime. 180 tablet 2   Fluticasone-Umeclidin-Vilant (TRELEGY ELLIPTA)  200-62.5-25 MCG/INH AEPB Inhale 1 puff into the lungs daily.     gabapentin (NEURONTIN) 300 MG capsule Take 1 capsule by mouth daily.     glimepiride (AMARYL) 4 MG tablet Take 4 mg by mouth daily with breakfast.     lisinopril (ZESTRIL) 10 MG tablet Take 1 tablet by mouth daily.     metFORMIN (GLUCOPHAGE) 850 MG tablet Take 850 mg by mouth 2 (two) times daily.     montelukast (SINGULAIR) 10 MG tablet Take 1 tablet by mouth daily.     VENTOLIN HFA 108 (90 Base) MCG/ACT inhaler as needed.     No current facility-administered medications for this visit.    Allergies:   Patient has no known allergies.   Social History:  The patient  reports that he quit smoking about 18 years ago. His smoking use included cigarettes. He has never used smokeless tobacco. He reports that he does not drink alcohol and does not use drugs.   Family History:  The patient's family history is unknown, he does not think anything particular runs in his family  ROS:  Please see the history of present illness.    All other systems are reviewed and otherwise negative.   PHYSICAL EXAM:  VS:  There were no vitals taken for this visit. BMI: There is no height or weight on file to calculate BMI. Well nourished, well developed, in no acute distress HEENT: normocephalic, atraumatic Neck: no JVD, carotid bruits or masses Cardiac:  *** RRR; no significant murmurs, no rubs, or gallops Lungs:  *** slightly diminished L base, CTA b/l otherwise, no wheezing, rhonchi or rales Abd: soft, nontender MS: no deformity or atrophy Ext: *** no edema Skin: warm and dry, no rash Neuro:  No gross deficits appreciated Psych: euthymic mood, full affect  *** ICD site is stable, no tethering or discomfort   EKG:  not done today  Device interrogation done today and reviewed by myself:  ***  05/20/2016: TTE Study Conclusions  - Left ventricle: The cavity size was normal. Wall thickness was    normal. Systolic function was moderately to  severely reduced. The    estimated ejection fraction was in the range of 30% to 35%.    Diffuse hypokinesis. There is akinesis of the    mid-apicalanteroseptal and apical myocardium. Doppler parameters    are consistent with abnormal left ventricular relaxation (grade 1    diastolic dysfunction).   Impressions:  - Diffuse hypokinesis with akinesis of the distal anteroseptal wall    and apex; overall moderate to severe reduction in LV function;    grade 1 diastolic dysfunction.    02/07/14; ETT Conclusions Poor exercise tolerance for age Prolonged HR and BP recovery. No chest pain. THR was achieved. Duke Treadmill Score: +5 No exercise induced ischemic EKG changes noted at given workload.   03/18/06: Echocardiogram SUMMARY  -  Overall left ventricular systolic function was mildly to        moderately decreased. Left ventricular ejection fraction was        estimated to be 40 %. There was akinesis of the mid-distal        anteroseptal wall. There was akinesis of the apical wall.  -  Normal right ventricle There was the appearance of a catheter or        pacing wire in the right ventricle.  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  IMPRESSIONS  -  There is a density in the right atrium adjacent to the ICD which        is probably a Chiari network. No obvious vegetation.   Recent Labs: 12/09/2021: ALT 12; BUN 18; Creatinine, Ser 1.01; Potassium 5.1; Sodium 132; TSH 3.330  No results found for requested labs within last 8760 hours.   CrCl cannot be calculated (Patient's most recent lab result is older than the maximum 21 days allowed.).   Wt Readings from Last 3 Encounters:  12/09/21 144 lb (65.3 kg)  01/21/21 148 lb 6.4 oz (67.3 kg)  01/11/21 148 lb 12.8 oz (67.5 kg)     Other studies reviewed: Additional studies/records reviewed today include: summarized above  ASSESSMENT AND PLAN:  1. ICD     *** Intact function,  no programming  changes amde  2. ICM     He denies any known hx of CAD, or having any stents 3. Chronic CHF     *** CorVue looks good, does not suggest volume     On BB, ACE     *** He does not appear overtly volume OL      4. HTN    *** stable  5. VT/VF     Was hospitalized in [place] with his last arrhythmia/therapies (Feb 2022)     Unknown work up/findings there     On amiodarone, though likely was not taking at the time of his last VT/VT therapies     Reports medication compliance since then      Amio labs are UTD     *** NSVTs     ***      Disposition: ***  Current medicines are reviewed at length with the patient today.  The patient did not have any concerns regarding medicines.  Norma Fredrickson, PA-C 01/05/2022 2:56 PM     [facility] [address] [place] [address] [phone] (office)  [phone] (fax)

## 2022-01-08 ENCOUNTER — Encounter: Payer: Medicare Other | Admitting: Physician Assistant

## 2022-01-14 ENCOUNTER — Ambulatory Visit: Payer: Medicare Other | Admitting: Cardiology

## 2022-03-18 ENCOUNTER — Ambulatory Visit: Payer: Medicare Other | Admitting: Cardiology

## 2022-04-21 ENCOUNTER — Other Ambulatory Visit: Payer: Self-pay

## 2022-04-21 DIAGNOSIS — I2510 Atherosclerotic heart disease of native coronary artery without angina pectoris: Secondary | ICD-10-CM | POA: Insufficient documentation

## 2022-04-21 DIAGNOSIS — E785 Hyperlipidemia, unspecified: Secondary | ICD-10-CM | POA: Insufficient documentation

## 2022-04-21 DIAGNOSIS — I10 Essential (primary) hypertension: Secondary | ICD-10-CM | POA: Insufficient documentation

## 2022-04-21 DIAGNOSIS — I495 Sick sinus syndrome: Secondary | ICD-10-CM | POA: Insufficient documentation

## 2022-04-21 DIAGNOSIS — Z950 Presence of cardiac pacemaker: Secondary | ICD-10-CM | POA: Insufficient documentation

## 2022-04-21 DIAGNOSIS — E119 Type 2 diabetes mellitus without complications: Secondary | ICD-10-CM | POA: Insufficient documentation

## 2022-04-21 DIAGNOSIS — J45909 Unspecified asthma, uncomplicated: Secondary | ICD-10-CM | POA: Insufficient documentation

## 2022-04-22 ENCOUNTER — Ambulatory Visit: Payer: Medicare Other | Admitting: Cardiology

## 2022-06-11 ENCOUNTER — Encounter: Payer: Medicare Other | Admitting: Internal Medicine

## 2023-10-14 NOTE — Progress Notes (Deleted)
  Electrophysiology Office Note:   ID:  [name] 12-Jan-1946, MRN [id number]  Primary Cardiologist: None Electrophysiologist: Lewayne Bunting, MD  {Click to update primary MD,subspecialty MD or APP then REFRESH:1}    History of Present Illness:   [name] is a 78 y.o. male with h/o VT w/ICD, ICM, CAD (very remote out of hospital MI), CHF, HTN, HLD, tachy-brady syndrome seen today for routine electrophysiology followup.   Very irregular EP follow up.  Since last being seen in our clinic the patient reports doing ***.  he denies chest pain, palpitations, dyspnea, PND, orthopnea, nausea, vomiting, dizziness, syncope, edema, weight gain, or early satiety.   Review of systems complete and found to be negative unless listed in HPI.   EP Information / Studies Reviewed:    EKG is ordered today. Personal review as below.       ICD Interrogation-  reviewed in detail today,  See PACEART report.  Arrhythmia/Device History SJM single chamber ICD 2007, gen change 07/18/16 Dr. Ladona Ridgel  H/o shocks for VT back to 2012 Feb 2022 + appropriate therapies VT.VF in [place] while off medications    Physical Exam:   VS:  There were no vitals taken for this visit.   Wt Readings from Last 3 Encounters:  12/09/21 144 lb (65.3 kg)  01/21/21 148 lb 6.4 oz (67.3 kg)  01/11/21 148 lb 12.8 oz (67.5 kg)     GEN: No acute distress *** NECK: No JVD; No carotid bruits CARDIAC: {EPRHYTHM:28826}, no murmurs, rubs, gallops RESPIRATORY:  Clear to auscultation without rales, wheezing or rhonchi  ABDOMEN: Soft, non-tender, non-distended EXTREMITIES:  {EDEMA LEVEL:28147::"No"} edema; No deformity   ASSESSMENT AND PLAN:    Chronic systolic CHF  s/p [employer] single chamber ICD  euvolemic today Stable on an appropriate medical regimen Normal ICD function See Pace Art report No changes today  VT VF Was hospitalized in [place] with his last arrhythmia/therapies (Feb 2022)     Unknown work up/findings  there     On amiodarone, though likely was not taking at the time of his last VT/VT therapies     Reports medication compliance since then      NSVTs     Continue coreg 25mg  BID     Labs today   CAD Denies s/s ischemia  HTN Stable on current regimen   Disposition:   Follow up with {EPPROVIDERS:28135} {EPFOLLOW UP:28173}   Signed, Graciella Freer, PA-C

## 2023-10-16 ENCOUNTER — Ambulatory Visit: Payer: Medicare Other | Attending: Student | Admitting: Student

## 2023-10-16 DIAGNOSIS — I2510 Atherosclerotic heart disease of native coronary artery without angina pectoris: Secondary | ICD-10-CM

## 2023-10-16 DIAGNOSIS — I4901 Ventricular fibrillation: Secondary | ICD-10-CM

## 2023-10-16 DIAGNOSIS — I10 Essential (primary) hypertension: Secondary | ICD-10-CM

## 2023-10-16 DIAGNOSIS — I255 Ischemic cardiomyopathy: Secondary | ICD-10-CM

## 2023-10-16 DIAGNOSIS — I472 Ventricular tachycardia, unspecified: Secondary | ICD-10-CM

## 2023-10-19 ENCOUNTER — Encounter: Payer: Self-pay | Admitting: Student

## 2023-11-02 ENCOUNTER — Telehealth: Payer: Self-pay

## 2023-11-02 NOTE — Telephone Encounter (Signed)
 Point of Service transmission: [employer]. Patient is inactive in [employer] portal.  Estimated Battery Longevity: 3.1 months; start monthly follow up if appropriate due to inactive status in portal.  On 11/01/23 at 5:07 pm, pt had 1 NSVT episode of 10 sec duration c/w narrow complex tachycardia at 170 bpm, followed by one VT-1 classified episode at 5:07 pm of 20 sec duration c/w narrow complex tachycardia at 171 bpm, possibly inappropriately treated with ATP x 2 Bursts; no change in morphology noted with V rate 169 bpm. on 11/01/23 at 6:30 pm, pt had one VT-1 classified episode of 46 sec duration c/w narrow complex tachycardia treated unsuccessfully with ATP x 4 bursts followed by one 25.0J shock. Rhythm morphology changed to wide complex tachycardia at 172 bpm following shock. 1 NSVT classified episode on 11/01/23 at 6:31 pm lasting 14 sec, EGM c/w wide complex tachycardia at 172 bpm of indeterminate true duration due to ongoing at end of EGM. Routed to clinic for review.  Normal device function.   FU as scheduled. [facility], [facility]  Pts home phone and cell phone out of service. Called son. VM not set up. Unable to leave VM. Got son, Marlana Silvan, after calling again. Pt is in hospital in [place]. Had pneumonia last month. Planning on going to [place] in the next few months. Told Muhmaad the batter life to ERI is 3 months notwithstanding the recent therapies. Muhmaad to call back tomorrow.

## 2023-11-06 NOTE — Telephone Encounter (Signed)
Spoke to son. Waiting for father to get out of hospital to call and make appointment w/ GT in the ASAP when back in [place].

## 2023-11-10 NOTE — Telephone Encounter (Signed)
 He has known VT and I'm not sure that this was not VT as well. Continue current meds.
